# Patient Record
Sex: Female | Born: 1950 | Race: White | Hispanic: No | Marital: Married | Smoking: Former smoker
Health system: Southern US, Community
[De-identification: ages and names within clinical notes are randomized; demographics above are authoritative.]

## PROBLEM LIST (undated history)

## (undated) DIAGNOSIS — R102 Pelvic and perineal pain: Secondary | ICD-10-CM

## (undated) DIAGNOSIS — Z860100 Personal history of colon polyps, unspecified: Secondary | ICD-10-CM

## (undated) DIAGNOSIS — G4700 Insomnia, unspecified: Secondary | ICD-10-CM

## (undated) DIAGNOSIS — E039 Hypothyroidism, unspecified: Secondary | ICD-10-CM

## (undated) DIAGNOSIS — E785 Hyperlipidemia, unspecified: Secondary | ICD-10-CM

## (undated) DIAGNOSIS — I10 Essential (primary) hypertension: Secondary | ICD-10-CM

## (undated) DIAGNOSIS — Q159 Congenital malformation of eye, unspecified: Secondary | ICD-10-CM

## (undated) DIAGNOSIS — K8020 Calculus of gallbladder without cholecystitis without obstruction: Secondary | ICD-10-CM

## (undated) DIAGNOSIS — C50919 Malignant neoplasm of unspecified site of unspecified female breast: Secondary | ICD-10-CM

## (undated) DIAGNOSIS — N390 Urinary tract infection, site not specified: Secondary | ICD-10-CM

## (undated) DIAGNOSIS — K5900 Constipation, unspecified: Secondary | ICD-10-CM

## (undated) DIAGNOSIS — Z800 Family history of malignant neoplasm of digestive organs: Secondary | ICD-10-CM

## (undated) HISTORY — PX: BREAST LUMPECTOMY: SHX2

## (undated) HISTORY — DX: Calculus of gallbladder without cholecystitis without obstruction: K80.20

## (undated) HISTORY — PX: BREAST BIOPSY: SHX20

## (undated) HISTORY — DX: Hyperlipidemia, unspecified: E78.5

## (undated) HISTORY — DX: Constipation, unspecified: K59.00

## (undated) HISTORY — DX: Personal history of colon polyps, unspecified: Z86.0100

## (undated) HISTORY — DX: Congenital malformation of eye, unspecified: Q15.9

## (undated) HISTORY — DX: Hypothyroidism, unspecified: E03.9

## (undated) HISTORY — DX: Urinary tract infection, site not specified: N39.0

## (undated) HISTORY — PX: ABDOMINAL HYSTERECTOMY: SUR658

## (undated) HISTORY — DX: Pelvic and perineal pain: R10.2

## (undated) HISTORY — DX: Essential (primary) hypertension: I10

## (undated) HISTORY — DX: Family history of malignant neoplasm of digestive organs: Z80.0

## (undated) HISTORY — DX: Insomnia, unspecified: G47.00

## (undated) HISTORY — DX: Personal history of colonic polyps: Z86.010

---

## 2013-04-28 DIAGNOSIS — E039 Hypothyroidism, unspecified: Secondary | ICD-10-CM

## 2013-04-28 DIAGNOSIS — H35359 Cystoid macular degeneration, unspecified eye: Secondary | ICD-10-CM

## 2013-04-28 HISTORY — DX: Cystoid macular degeneration, unspecified eye: H35.359

## 2013-04-28 HISTORY — DX: Hypothyroidism, unspecified: E03.9

## 2013-05-12 ENCOUNTER — Encounter (INDEPENDENT_AMBULATORY_CARE_PROVIDER_SITE_OTHER): Payer: Self-pay | Admitting: Ophthalmology

## 2013-07-09 ENCOUNTER — Other Ambulatory Visit: Payer: Self-pay | Admitting: Nurse Practitioner

## 2013-07-09 DIAGNOSIS — H3552 Pigmentary retinal dystrophy: Secondary | ICD-10-CM

## 2013-07-09 DIAGNOSIS — Z1231 Encounter for screening mammogram for malignant neoplasm of breast: Secondary | ICD-10-CM

## 2013-07-09 HISTORY — DX: Pigmentary retinal dystrophy: H35.52

## 2013-07-14 DIAGNOSIS — F325 Major depressive disorder, single episode, in full remission: Secondary | ICD-10-CM

## 2013-07-14 HISTORY — DX: Major depressive disorder, single episode, in full remission: F32.5

## 2014-11-24 ENCOUNTER — Ambulatory Visit: Payer: Self-pay | Admitting: Obstetrics & Gynecology

## 2015-04-03 DIAGNOSIS — H3552 Pigmentary retinal dystrophy: Secondary | ICD-10-CM | POA: Diagnosis not present

## 2015-04-03 DIAGNOSIS — H43812 Vitreous degeneration, left eye: Secondary | ICD-10-CM | POA: Diagnosis not present

## 2015-04-03 DIAGNOSIS — H35353 Cystoid macular degeneration, bilateral: Secondary | ICD-10-CM | POA: Diagnosis not present

## 2015-04-12 DIAGNOSIS — F341 Dysthymic disorder: Secondary | ICD-10-CM | POA: Diagnosis not present

## 2015-04-12 DIAGNOSIS — E039 Hypothyroidism, unspecified: Secondary | ICD-10-CM | POA: Diagnosis not present

## 2015-05-23 DIAGNOSIS — R413 Other amnesia: Secondary | ICD-10-CM | POA: Diagnosis not present

## 2015-06-05 DIAGNOSIS — R413 Other amnesia: Secondary | ICD-10-CM | POA: Diagnosis not present

## 2015-06-06 DIAGNOSIS — H3552 Pigmentary retinal dystrophy: Secondary | ICD-10-CM | POA: Diagnosis not present

## 2015-06-06 DIAGNOSIS — H43812 Vitreous degeneration, left eye: Secondary | ICD-10-CM | POA: Diagnosis not present

## 2015-06-06 DIAGNOSIS — H35353 Cystoid macular degeneration, bilateral: Secondary | ICD-10-CM | POA: Diagnosis not present

## 2015-06-14 DIAGNOSIS — R413 Other amnesia: Secondary | ICD-10-CM | POA: Diagnosis not present

## 2015-08-04 DIAGNOSIS — H35353 Cystoid macular degeneration, bilateral: Secondary | ICD-10-CM | POA: Diagnosis not present

## 2015-08-04 DIAGNOSIS — H43812 Vitreous degeneration, left eye: Secondary | ICD-10-CM | POA: Diagnosis not present

## 2015-08-04 DIAGNOSIS — H3552 Pigmentary retinal dystrophy: Secondary | ICD-10-CM | POA: Diagnosis not present

## 2015-11-21 DIAGNOSIS — H35353 Cystoid macular degeneration, bilateral: Secondary | ICD-10-CM | POA: Diagnosis not present

## 2015-11-21 DIAGNOSIS — H35372 Puckering of macula, left eye: Secondary | ICD-10-CM | POA: Diagnosis not present

## 2015-11-21 DIAGNOSIS — H43812 Vitreous degeneration, left eye: Secondary | ICD-10-CM | POA: Diagnosis not present

## 2015-11-21 DIAGNOSIS — H3552 Pigmentary retinal dystrophy: Secondary | ICD-10-CM | POA: Diagnosis not present

## 2015-12-06 ENCOUNTER — Other Ambulatory Visit: Payer: Self-pay | Admitting: Chiropractor

## 2015-12-06 ENCOUNTER — Ambulatory Visit
Admission: RE | Admit: 2015-12-06 | Discharge: 2015-12-06 | Disposition: A | Discharge status: Home / Self Care | Payer: Medicare Other | Source: Ambulatory Visit | Attending: Chiropractor | Admitting: Chiropractor

## 2015-12-06 DIAGNOSIS — M545 Low back pain: Secondary | ICD-10-CM

## 2015-12-06 DIAGNOSIS — M25561 Pain in right knee: Secondary | ICD-10-CM

## 2015-12-06 DIAGNOSIS — M47814 Spondylosis without myelopathy or radiculopathy, thoracic region: Secondary | ICD-10-CM | POA: Diagnosis not present

## 2015-12-11 DIAGNOSIS — M9905 Segmental and somatic dysfunction of pelvic region: Secondary | ICD-10-CM | POA: Diagnosis not present

## 2015-12-11 DIAGNOSIS — S29012A Strain of muscle and tendon of back wall of thorax, initial encounter: Secondary | ICD-10-CM | POA: Diagnosis not present

## 2015-12-11 DIAGNOSIS — S39012A Strain of muscle, fascia and tendon of lower back, initial encounter: Secondary | ICD-10-CM | POA: Diagnosis not present

## 2015-12-11 DIAGNOSIS — M9903 Segmental and somatic dysfunction of lumbar region: Secondary | ICD-10-CM | POA: Diagnosis not present

## 2015-12-11 DIAGNOSIS — S338XXA Sprain of other parts of lumbar spine and pelvis, initial encounter: Secondary | ICD-10-CM | POA: Diagnosis not present

## 2015-12-11 DIAGNOSIS — M9902 Segmental and somatic dysfunction of thoracic region: Secondary | ICD-10-CM | POA: Diagnosis not present

## 2015-12-15 DIAGNOSIS — M9903 Segmental and somatic dysfunction of lumbar region: Secondary | ICD-10-CM | POA: Diagnosis not present

## 2015-12-15 DIAGNOSIS — S338XXA Sprain of other parts of lumbar spine and pelvis, initial encounter: Secondary | ICD-10-CM | POA: Diagnosis not present

## 2015-12-15 DIAGNOSIS — S39012A Strain of muscle, fascia and tendon of lower back, initial encounter: Secondary | ICD-10-CM | POA: Diagnosis not present

## 2015-12-15 DIAGNOSIS — M9905 Segmental and somatic dysfunction of pelvic region: Secondary | ICD-10-CM | POA: Diagnosis not present

## 2015-12-15 DIAGNOSIS — S29012A Strain of muscle and tendon of back wall of thorax, initial encounter: Secondary | ICD-10-CM | POA: Diagnosis not present

## 2015-12-15 DIAGNOSIS — M9902 Segmental and somatic dysfunction of thoracic region: Secondary | ICD-10-CM | POA: Diagnosis not present

## 2015-12-18 DIAGNOSIS — S29012A Strain of muscle and tendon of back wall of thorax, initial encounter: Secondary | ICD-10-CM | POA: Diagnosis not present

## 2015-12-18 DIAGNOSIS — M9902 Segmental and somatic dysfunction of thoracic region: Secondary | ICD-10-CM | POA: Diagnosis not present

## 2015-12-18 DIAGNOSIS — S39012A Strain of muscle, fascia and tendon of lower back, initial encounter: Secondary | ICD-10-CM | POA: Diagnosis not present

## 2015-12-18 DIAGNOSIS — M9903 Segmental and somatic dysfunction of lumbar region: Secondary | ICD-10-CM | POA: Diagnosis not present

## 2015-12-18 DIAGNOSIS — S338XXA Sprain of other parts of lumbar spine and pelvis, initial encounter: Secondary | ICD-10-CM | POA: Diagnosis not present

## 2015-12-18 DIAGNOSIS — M9905 Segmental and somatic dysfunction of pelvic region: Secondary | ICD-10-CM | POA: Diagnosis not present

## 2015-12-22 DIAGNOSIS — S29012A Strain of muscle and tendon of back wall of thorax, initial encounter: Secondary | ICD-10-CM | POA: Diagnosis not present

## 2015-12-22 DIAGNOSIS — M9903 Segmental and somatic dysfunction of lumbar region: Secondary | ICD-10-CM | POA: Diagnosis not present

## 2015-12-22 DIAGNOSIS — S39012A Strain of muscle, fascia and tendon of lower back, initial encounter: Secondary | ICD-10-CM | POA: Diagnosis not present

## 2015-12-22 DIAGNOSIS — M9902 Segmental and somatic dysfunction of thoracic region: Secondary | ICD-10-CM | POA: Diagnosis not present

## 2015-12-22 DIAGNOSIS — M9905 Segmental and somatic dysfunction of pelvic region: Secondary | ICD-10-CM | POA: Diagnosis not present

## 2015-12-22 DIAGNOSIS — S338XXA Sprain of other parts of lumbar spine and pelvis, initial encounter: Secondary | ICD-10-CM | POA: Diagnosis not present

## 2015-12-25 DIAGNOSIS — M9903 Segmental and somatic dysfunction of lumbar region: Secondary | ICD-10-CM | POA: Diagnosis not present

## 2015-12-25 DIAGNOSIS — S338XXA Sprain of other parts of lumbar spine and pelvis, initial encounter: Secondary | ICD-10-CM | POA: Diagnosis not present

## 2015-12-25 DIAGNOSIS — M9902 Segmental and somatic dysfunction of thoracic region: Secondary | ICD-10-CM | POA: Diagnosis not present

## 2015-12-25 DIAGNOSIS — M9905 Segmental and somatic dysfunction of pelvic region: Secondary | ICD-10-CM | POA: Diagnosis not present

## 2015-12-25 DIAGNOSIS — S39012A Strain of muscle, fascia and tendon of lower back, initial encounter: Secondary | ICD-10-CM | POA: Diagnosis not present

## 2015-12-25 DIAGNOSIS — S29012A Strain of muscle and tendon of back wall of thorax, initial encounter: Secondary | ICD-10-CM | POA: Diagnosis not present

## 2015-12-27 DIAGNOSIS — Z23 Encounter for immunization: Secondary | ICD-10-CM | POA: Diagnosis not present

## 2015-12-28 DIAGNOSIS — S39012A Strain of muscle, fascia and tendon of lower back, initial encounter: Secondary | ICD-10-CM | POA: Diagnosis not present

## 2015-12-28 DIAGNOSIS — M9905 Segmental and somatic dysfunction of pelvic region: Secondary | ICD-10-CM | POA: Diagnosis not present

## 2015-12-28 DIAGNOSIS — M9903 Segmental and somatic dysfunction of lumbar region: Secondary | ICD-10-CM | POA: Diagnosis not present

## 2015-12-28 DIAGNOSIS — M9902 Segmental and somatic dysfunction of thoracic region: Secondary | ICD-10-CM | POA: Diagnosis not present

## 2015-12-28 DIAGNOSIS — S29012A Strain of muscle and tendon of back wall of thorax, initial encounter: Secondary | ICD-10-CM | POA: Diagnosis not present

## 2015-12-28 DIAGNOSIS — S338XXA Sprain of other parts of lumbar spine and pelvis, initial encounter: Secondary | ICD-10-CM | POA: Diagnosis not present

## 2016-01-04 DIAGNOSIS — S39012A Strain of muscle, fascia and tendon of lower back, initial encounter: Secondary | ICD-10-CM | POA: Diagnosis not present

## 2016-01-04 DIAGNOSIS — S29012A Strain of muscle and tendon of back wall of thorax, initial encounter: Secondary | ICD-10-CM | POA: Diagnosis not present

## 2016-01-04 DIAGNOSIS — S338XXA Sprain of other parts of lumbar spine and pelvis, initial encounter: Secondary | ICD-10-CM | POA: Diagnosis not present

## 2016-01-04 DIAGNOSIS — M9902 Segmental and somatic dysfunction of thoracic region: Secondary | ICD-10-CM | POA: Diagnosis not present

## 2016-01-04 DIAGNOSIS — M9905 Segmental and somatic dysfunction of pelvic region: Secondary | ICD-10-CM | POA: Diagnosis not present

## 2016-01-04 DIAGNOSIS — M9903 Segmental and somatic dysfunction of lumbar region: Secondary | ICD-10-CM | POA: Diagnosis not present

## 2016-01-09 DIAGNOSIS — M9905 Segmental and somatic dysfunction of pelvic region: Secondary | ICD-10-CM | POA: Diagnosis not present

## 2016-01-09 DIAGNOSIS — M9903 Segmental and somatic dysfunction of lumbar region: Secondary | ICD-10-CM | POA: Diagnosis not present

## 2016-01-09 DIAGNOSIS — S338XXA Sprain of other parts of lumbar spine and pelvis, initial encounter: Secondary | ICD-10-CM | POA: Diagnosis not present

## 2016-01-09 DIAGNOSIS — S39012A Strain of muscle, fascia and tendon of lower back, initial encounter: Secondary | ICD-10-CM | POA: Diagnosis not present

## 2016-01-09 DIAGNOSIS — S29012A Strain of muscle and tendon of back wall of thorax, initial encounter: Secondary | ICD-10-CM | POA: Diagnosis not present

## 2016-01-09 DIAGNOSIS — M9902 Segmental and somatic dysfunction of thoracic region: Secondary | ICD-10-CM | POA: Diagnosis not present

## 2016-01-23 DIAGNOSIS — S29012A Strain of muscle and tendon of back wall of thorax, initial encounter: Secondary | ICD-10-CM | POA: Diagnosis not present

## 2016-01-23 DIAGNOSIS — M9905 Segmental and somatic dysfunction of pelvic region: Secondary | ICD-10-CM | POA: Diagnosis not present

## 2016-01-23 DIAGNOSIS — S39012A Strain of muscle, fascia and tendon of lower back, initial encounter: Secondary | ICD-10-CM | POA: Diagnosis not present

## 2016-01-23 DIAGNOSIS — M9902 Segmental and somatic dysfunction of thoracic region: Secondary | ICD-10-CM | POA: Diagnosis not present

## 2016-01-23 DIAGNOSIS — M9903 Segmental and somatic dysfunction of lumbar region: Secondary | ICD-10-CM | POA: Diagnosis not present

## 2016-01-23 DIAGNOSIS — S338XXA Sprain of other parts of lumbar spine and pelvis, initial encounter: Secondary | ICD-10-CM | POA: Diagnosis not present

## 2016-02-06 DIAGNOSIS — M9903 Segmental and somatic dysfunction of lumbar region: Secondary | ICD-10-CM | POA: Diagnosis not present

## 2016-02-06 DIAGNOSIS — M9902 Segmental and somatic dysfunction of thoracic region: Secondary | ICD-10-CM | POA: Diagnosis not present

## 2016-02-06 DIAGNOSIS — S29012A Strain of muscle and tendon of back wall of thorax, initial encounter: Secondary | ICD-10-CM | POA: Diagnosis not present

## 2016-02-06 DIAGNOSIS — S39012A Strain of muscle, fascia and tendon of lower back, initial encounter: Secondary | ICD-10-CM | POA: Diagnosis not present

## 2016-02-06 DIAGNOSIS — M9905 Segmental and somatic dysfunction of pelvic region: Secondary | ICD-10-CM | POA: Diagnosis not present

## 2016-02-06 DIAGNOSIS — S338XXA Sprain of other parts of lumbar spine and pelvis, initial encounter: Secondary | ICD-10-CM | POA: Diagnosis not present

## 2016-02-09 DIAGNOSIS — H3552 Pigmentary retinal dystrophy: Secondary | ICD-10-CM | POA: Diagnosis not present

## 2016-02-09 DIAGNOSIS — H35372 Puckering of macula, left eye: Secondary | ICD-10-CM | POA: Diagnosis not present

## 2016-02-09 DIAGNOSIS — H35353 Cystoid macular degeneration, bilateral: Secondary | ICD-10-CM | POA: Diagnosis not present

## 2016-02-09 DIAGNOSIS — H43812 Vitreous degeneration, left eye: Secondary | ICD-10-CM | POA: Diagnosis not present

## 2016-02-14 DIAGNOSIS — K449 Diaphragmatic hernia without obstruction or gangrene: Secondary | ICD-10-CM | POA: Insufficient documentation

## 2016-02-14 DIAGNOSIS — E039 Hypothyroidism, unspecified: Secondary | ICD-10-CM | POA: Diagnosis not present

## 2016-02-14 DIAGNOSIS — Z1231 Encounter for screening mammogram for malignant neoplasm of breast: Secondary | ICD-10-CM | POA: Diagnosis not present

## 2016-02-14 DIAGNOSIS — Z1159 Encounter for screening for other viral diseases: Secondary | ICD-10-CM | POA: Diagnosis not present

## 2016-02-14 DIAGNOSIS — F341 Dysthymic disorder: Secondary | ICD-10-CM | POA: Diagnosis not present

## 2016-02-14 DIAGNOSIS — F17210 Nicotine dependence, cigarettes, uncomplicated: Secondary | ICD-10-CM | POA: Diagnosis not present

## 2016-02-14 HISTORY — DX: Diaphragmatic hernia without obstruction or gangrene: K44.9

## 2016-02-21 ENCOUNTER — Other Ambulatory Visit: Payer: Self-pay | Admitting: Nurse Practitioner

## 2016-02-21 DIAGNOSIS — Z1231 Encounter for screening mammogram for malignant neoplasm of breast: Secondary | ICD-10-CM

## 2016-02-21 DIAGNOSIS — E2839 Other primary ovarian failure: Secondary | ICD-10-CM

## 2016-02-27 DIAGNOSIS — M9903 Segmental and somatic dysfunction of lumbar region: Secondary | ICD-10-CM | POA: Diagnosis not present

## 2016-02-27 DIAGNOSIS — M9905 Segmental and somatic dysfunction of pelvic region: Secondary | ICD-10-CM | POA: Diagnosis not present

## 2016-02-27 DIAGNOSIS — S39012A Strain of muscle, fascia and tendon of lower back, initial encounter: Secondary | ICD-10-CM | POA: Diagnosis not present

## 2016-02-27 DIAGNOSIS — M9902 Segmental and somatic dysfunction of thoracic region: Secondary | ICD-10-CM | POA: Diagnosis not present

## 2016-02-27 DIAGNOSIS — S338XXA Sprain of other parts of lumbar spine and pelvis, initial encounter: Secondary | ICD-10-CM | POA: Diagnosis not present

## 2016-02-27 DIAGNOSIS — S29012A Strain of muscle and tendon of back wall of thorax, initial encounter: Secondary | ICD-10-CM | POA: Diagnosis not present

## 2016-03-22 DIAGNOSIS — H35373 Puckering of macula, bilateral: Secondary | ICD-10-CM | POA: Diagnosis not present

## 2016-03-22 DIAGNOSIS — H43812 Vitreous degeneration, left eye: Secondary | ICD-10-CM | POA: Diagnosis not present

## 2016-03-22 DIAGNOSIS — H35353 Cystoid macular degeneration, bilateral: Secondary | ICD-10-CM | POA: Diagnosis not present

## 2016-03-22 DIAGNOSIS — H3552 Pigmentary retinal dystrophy: Secondary | ICD-10-CM | POA: Diagnosis not present

## 2016-03-26 DIAGNOSIS — S29012A Strain of muscle and tendon of back wall of thorax, initial encounter: Secondary | ICD-10-CM | POA: Diagnosis not present

## 2016-03-26 DIAGNOSIS — M9902 Segmental and somatic dysfunction of thoracic region: Secondary | ICD-10-CM | POA: Diagnosis not present

## 2016-03-26 DIAGNOSIS — M9903 Segmental and somatic dysfunction of lumbar region: Secondary | ICD-10-CM | POA: Diagnosis not present

## 2016-03-26 DIAGNOSIS — M9905 Segmental and somatic dysfunction of pelvic region: Secondary | ICD-10-CM | POA: Diagnosis not present

## 2016-03-26 DIAGNOSIS — S338XXA Sprain of other parts of lumbar spine and pelvis, initial encounter: Secondary | ICD-10-CM | POA: Diagnosis not present

## 2016-03-26 DIAGNOSIS — S39012A Strain of muscle, fascia and tendon of lower back, initial encounter: Secondary | ICD-10-CM | POA: Diagnosis not present

## 2016-04-26 DIAGNOSIS — M9905 Segmental and somatic dysfunction of pelvic region: Secondary | ICD-10-CM | POA: Diagnosis not present

## 2016-04-26 DIAGNOSIS — S29012A Strain of muscle and tendon of back wall of thorax, initial encounter: Secondary | ICD-10-CM | POA: Diagnosis not present

## 2016-04-26 DIAGNOSIS — S338XXA Sprain of other parts of lumbar spine and pelvis, initial encounter: Secondary | ICD-10-CM | POA: Diagnosis not present

## 2016-04-26 DIAGNOSIS — M9902 Segmental and somatic dysfunction of thoracic region: Secondary | ICD-10-CM | POA: Diagnosis not present

## 2016-04-26 DIAGNOSIS — S39012A Strain of muscle, fascia and tendon of lower back, initial encounter: Secondary | ICD-10-CM | POA: Diagnosis not present

## 2016-04-26 DIAGNOSIS — M9903 Segmental and somatic dysfunction of lumbar region: Secondary | ICD-10-CM | POA: Diagnosis not present

## 2016-05-24 DIAGNOSIS — H35353 Cystoid macular degeneration, bilateral: Secondary | ICD-10-CM | POA: Diagnosis not present

## 2016-05-24 DIAGNOSIS — H3552 Pigmentary retinal dystrophy: Secondary | ICD-10-CM | POA: Diagnosis not present

## 2016-05-24 DIAGNOSIS — H43812 Vitreous degeneration, left eye: Secondary | ICD-10-CM | POA: Diagnosis not present

## 2016-05-24 DIAGNOSIS — H35373 Puckering of macula, bilateral: Secondary | ICD-10-CM | POA: Diagnosis not present

## 2016-07-15 DIAGNOSIS — M9902 Segmental and somatic dysfunction of thoracic region: Secondary | ICD-10-CM | POA: Diagnosis not present

## 2016-07-15 DIAGNOSIS — S338XXA Sprain of other parts of lumbar spine and pelvis, initial encounter: Secondary | ICD-10-CM | POA: Diagnosis not present

## 2016-07-15 DIAGNOSIS — M9905 Segmental and somatic dysfunction of pelvic region: Secondary | ICD-10-CM | POA: Diagnosis not present

## 2016-07-15 DIAGNOSIS — S39012A Strain of muscle, fascia and tendon of lower back, initial encounter: Secondary | ICD-10-CM | POA: Diagnosis not present

## 2016-07-15 DIAGNOSIS — M9903 Segmental and somatic dysfunction of lumbar region: Secondary | ICD-10-CM | POA: Diagnosis not present

## 2016-07-15 DIAGNOSIS — S29012A Strain of muscle and tendon of back wall of thorax, initial encounter: Secondary | ICD-10-CM | POA: Diagnosis not present

## 2016-08-16 DIAGNOSIS — H43821 Vitreomacular adhesion, right eye: Secondary | ICD-10-CM | POA: Diagnosis not present

## 2016-08-16 DIAGNOSIS — H35353 Cystoid macular degeneration, bilateral: Secondary | ICD-10-CM | POA: Diagnosis not present

## 2016-08-16 DIAGNOSIS — H43812 Vitreous degeneration, left eye: Secondary | ICD-10-CM | POA: Diagnosis not present

## 2016-08-16 DIAGNOSIS — H3552 Pigmentary retinal dystrophy: Secondary | ICD-10-CM | POA: Diagnosis not present

## 2016-11-15 DIAGNOSIS — Z23 Encounter for immunization: Secondary | ICD-10-CM | POA: Diagnosis not present

## 2016-11-15 DIAGNOSIS — H3581 Retinal edema: Secondary | ICD-10-CM | POA: Diagnosis not present

## 2016-11-15 DIAGNOSIS — E039 Hypothyroidism, unspecified: Secondary | ICD-10-CM | POA: Diagnosis not present

## 2016-11-15 DIAGNOSIS — F320 Major depressive disorder, single episode, mild: Secondary | ICD-10-CM | POA: Diagnosis not present

## 2016-11-22 DIAGNOSIS — H35373 Puckering of macula, bilateral: Secondary | ICD-10-CM | POA: Diagnosis not present

## 2016-11-22 DIAGNOSIS — H35353 Cystoid macular degeneration, bilateral: Secondary | ICD-10-CM | POA: Diagnosis not present

## 2016-11-22 DIAGNOSIS — H43821 Vitreomacular adhesion, right eye: Secondary | ICD-10-CM | POA: Diagnosis not present

## 2016-11-22 DIAGNOSIS — H43812 Vitreous degeneration, left eye: Secondary | ICD-10-CM | POA: Diagnosis not present

## 2017-04-23 DIAGNOSIS — H35341 Macular cyst, hole, or pseudohole, right eye: Secondary | ICD-10-CM | POA: Diagnosis not present

## 2017-04-23 DIAGNOSIS — H3552 Pigmentary retinal dystrophy: Secondary | ICD-10-CM | POA: Diagnosis not present

## 2017-04-23 DIAGNOSIS — H35353 Cystoid macular degeneration, bilateral: Secondary | ICD-10-CM | POA: Diagnosis not present

## 2017-04-23 DIAGNOSIS — H35373 Puckering of macula, bilateral: Secondary | ICD-10-CM | POA: Diagnosis not present

## 2017-08-22 DIAGNOSIS — H35353 Cystoid macular degeneration, bilateral: Secondary | ICD-10-CM | POA: Diagnosis not present

## 2017-08-22 DIAGNOSIS — H35373 Puckering of macula, bilateral: Secondary | ICD-10-CM | POA: Diagnosis not present

## 2017-08-22 DIAGNOSIS — H30043 Focal chorioretinal inflammation, macular or paramacular, bilateral: Secondary | ICD-10-CM | POA: Diagnosis not present

## 2017-08-22 DIAGNOSIS — H3552 Pigmentary retinal dystrophy: Secondary | ICD-10-CM | POA: Diagnosis not present

## 2017-09-12 DIAGNOSIS — H35352 Cystoid macular degeneration, left eye: Secondary | ICD-10-CM | POA: Diagnosis not present

## 2017-09-12 DIAGNOSIS — H30042 Focal chorioretinal inflammation, macular or paramacular, left eye: Secondary | ICD-10-CM | POA: Diagnosis not present

## 2017-09-26 DIAGNOSIS — Z131 Encounter for screening for diabetes mellitus: Secondary | ICD-10-CM | POA: Diagnosis not present

## 2017-09-26 DIAGNOSIS — H3581 Retinal edema: Secondary | ICD-10-CM | POA: Diagnosis not present

## 2017-09-26 DIAGNOSIS — Z0000 Encounter for general adult medical examination without abnormal findings: Secondary | ICD-10-CM | POA: Diagnosis not present

## 2017-09-26 DIAGNOSIS — Z1239 Encounter for other screening for malignant neoplasm of breast: Secondary | ICD-10-CM | POA: Diagnosis not present

## 2017-09-26 DIAGNOSIS — Z136 Encounter for screening for cardiovascular disorders: Secondary | ICD-10-CM | POA: Diagnosis not present

## 2017-09-26 DIAGNOSIS — F320 Major depressive disorder, single episode, mild: Secondary | ICD-10-CM | POA: Diagnosis not present

## 2017-09-26 DIAGNOSIS — E039 Hypothyroidism, unspecified: Secondary | ICD-10-CM | POA: Diagnosis not present

## 2017-09-30 ENCOUNTER — Other Ambulatory Visit: Payer: Self-pay | Admitting: Family Medicine

## 2017-09-30 DIAGNOSIS — Z1231 Encounter for screening mammogram for malignant neoplasm of breast: Secondary | ICD-10-CM

## 2017-10-14 DIAGNOSIS — H43821 Vitreomacular adhesion, right eye: Secondary | ICD-10-CM | POA: Diagnosis not present

## 2017-10-14 DIAGNOSIS — H35373 Puckering of macula, bilateral: Secondary | ICD-10-CM | POA: Diagnosis not present

## 2017-10-14 DIAGNOSIS — H35351 Cystoid macular degeneration, right eye: Secondary | ICD-10-CM | POA: Diagnosis not present

## 2017-10-14 DIAGNOSIS — H3552 Pigmentary retinal dystrophy: Secondary | ICD-10-CM | POA: Diagnosis not present

## 2017-10-17 ENCOUNTER — Ambulatory Visit
Admission: RE | Admit: 2017-10-17 | Discharge: 2017-10-17 | Disposition: A | Discharge status: Home / Self Care | Payer: Medicare HMO | Source: Ambulatory Visit | Attending: Family Medicine | Admitting: Family Medicine

## 2017-10-17 DIAGNOSIS — Z1231 Encounter for screening mammogram for malignant neoplasm of breast: Secondary | ICD-10-CM

## 2017-12-19 DIAGNOSIS — Z961 Presence of intraocular lens: Secondary | ICD-10-CM | POA: Diagnosis not present

## 2017-12-19 DIAGNOSIS — H3581 Retinal edema: Secondary | ICD-10-CM | POA: Diagnosis not present

## 2017-12-19 DIAGNOSIS — H30033 Focal chorioretinal inflammation, peripheral, bilateral: Secondary | ICD-10-CM | POA: Insufficient documentation

## 2017-12-19 HISTORY — DX: Presence of intraocular lens: Z96.1

## 2017-12-19 HISTORY — DX: Focal chorioretinal inflammation, peripheral, bilateral: H30.033

## 2017-12-22 DIAGNOSIS — H30033 Focal chorioretinal inflammation, peripheral, bilateral: Secondary | ICD-10-CM | POA: Diagnosis not present

## 2017-12-29 DIAGNOSIS — H30033 Focal chorioretinal inflammation, peripheral, bilateral: Secondary | ICD-10-CM | POA: Diagnosis not present

## 2018-01-01 DIAGNOSIS — H3581 Retinal edema: Secondary | ICD-10-CM | POA: Diagnosis not present

## 2018-01-01 DIAGNOSIS — H30033 Focal chorioretinal inflammation, peripheral, bilateral: Secondary | ICD-10-CM | POA: Diagnosis not present

## 2018-01-01 DIAGNOSIS — Z961 Presence of intraocular lens: Secondary | ICD-10-CM | POA: Diagnosis not present

## 2018-01-06 DIAGNOSIS — Z800 Family history of malignant neoplasm of digestive organs: Secondary | ICD-10-CM | POA: Diagnosis not present

## 2018-01-06 DIAGNOSIS — Z86010 Personal history of colonic polyps: Secondary | ICD-10-CM | POA: Diagnosis not present

## 2018-01-06 DIAGNOSIS — D126 Benign neoplasm of colon, unspecified: Secondary | ICD-10-CM | POA: Diagnosis not present

## 2018-01-06 DIAGNOSIS — K5730 Diverticulosis of large intestine without perforation or abscess without bleeding: Secondary | ICD-10-CM | POA: Diagnosis not present

## 2018-01-09 DIAGNOSIS — D126 Benign neoplasm of colon, unspecified: Secondary | ICD-10-CM | POA: Diagnosis not present

## 2018-03-24 DIAGNOSIS — Z79899 Other long term (current) drug therapy: Secondary | ICD-10-CM | POA: Diagnosis not present

## 2018-03-24 DIAGNOSIS — H30033 Focal chorioretinal inflammation, peripheral, bilateral: Secondary | ICD-10-CM | POA: Diagnosis not present

## 2018-03-24 DIAGNOSIS — H44113 Panuveitis, bilateral: Secondary | ICD-10-CM | POA: Diagnosis not present

## 2018-03-24 DIAGNOSIS — H3581 Retinal edema: Secondary | ICD-10-CM | POA: Diagnosis not present

## 2018-03-24 DIAGNOSIS — Z961 Presence of intraocular lens: Secondary | ICD-10-CM | POA: Diagnosis not present

## 2018-06-02 DIAGNOSIS — Z79899 Other long term (current) drug therapy: Secondary | ICD-10-CM | POA: Diagnosis not present

## 2018-06-02 DIAGNOSIS — H30033 Focal chorioretinal inflammation, peripheral, bilateral: Secondary | ICD-10-CM | POA: Diagnosis not present

## 2018-06-02 DIAGNOSIS — H3581 Retinal edema: Secondary | ICD-10-CM | POA: Diagnosis not present

## 2018-06-02 DIAGNOSIS — Z961 Presence of intraocular lens: Secondary | ICD-10-CM | POA: Diagnosis not present

## 2018-06-02 DIAGNOSIS — H44113 Panuveitis, bilateral: Secondary | ICD-10-CM | POA: Diagnosis not present

## 2018-06-10 DIAGNOSIS — H44113 Panuveitis, bilateral: Secondary | ICD-10-CM | POA: Diagnosis not present

## 2018-06-10 DIAGNOSIS — H30033 Focal chorioretinal inflammation, peripheral, bilateral: Secondary | ICD-10-CM | POA: Diagnosis not present

## 2018-06-10 DIAGNOSIS — Z79899 Other long term (current) drug therapy: Secondary | ICD-10-CM | POA: Diagnosis not present

## 2018-08-11 DIAGNOSIS — H44113 Panuveitis, bilateral: Secondary | ICD-10-CM | POA: Diagnosis not present

## 2018-08-11 DIAGNOSIS — H30033 Focal chorioretinal inflammation, peripheral, bilateral: Secondary | ICD-10-CM | POA: Diagnosis not present

## 2018-08-11 DIAGNOSIS — H3581 Retinal edema: Secondary | ICD-10-CM | POA: Diagnosis not present

## 2018-08-11 DIAGNOSIS — Z961 Presence of intraocular lens: Secondary | ICD-10-CM | POA: Diagnosis not present

## 2018-08-11 DIAGNOSIS — Z79899 Other long term (current) drug therapy: Secondary | ICD-10-CM | POA: Diagnosis not present

## 2018-10-02 DIAGNOSIS — E785 Hyperlipidemia, unspecified: Secondary | ICD-10-CM | POA: Diagnosis not present

## 2018-10-02 DIAGNOSIS — H3552 Pigmentary retinal dystrophy: Secondary | ICD-10-CM | POA: Diagnosis not present

## 2018-10-02 DIAGNOSIS — Z0000 Encounter for general adult medical examination without abnormal findings: Secondary | ICD-10-CM | POA: Diagnosis not present

## 2018-10-02 DIAGNOSIS — E039 Hypothyroidism, unspecified: Secondary | ICD-10-CM | POA: Diagnosis not present

## 2018-10-19 DIAGNOSIS — Z0000 Encounter for general adult medical examination without abnormal findings: Secondary | ICD-10-CM | POA: Diagnosis not present

## 2018-10-19 DIAGNOSIS — E785 Hyperlipidemia, unspecified: Secondary | ICD-10-CM | POA: Diagnosis not present

## 2018-10-19 DIAGNOSIS — E039 Hypothyroidism, unspecified: Secondary | ICD-10-CM | POA: Diagnosis not present

## 2018-10-27 DIAGNOSIS — H44113 Panuveitis, bilateral: Secondary | ICD-10-CM | POA: Diagnosis not present

## 2018-10-27 DIAGNOSIS — H30033 Focal chorioretinal inflammation, peripheral, bilateral: Secondary | ICD-10-CM | POA: Diagnosis not present

## 2018-10-27 DIAGNOSIS — Z961 Presence of intraocular lens: Secondary | ICD-10-CM | POA: Diagnosis not present

## 2018-10-27 DIAGNOSIS — H3581 Retinal edema: Secondary | ICD-10-CM | POA: Diagnosis not present

## 2018-10-27 DIAGNOSIS — Z79899 Other long term (current) drug therapy: Secondary | ICD-10-CM | POA: Diagnosis not present

## 2018-11-25 ENCOUNTER — Other Ambulatory Visit: Payer: Self-pay | Admitting: Family Medicine

## 2018-11-25 DIAGNOSIS — Z1231 Encounter for screening mammogram for malignant neoplasm of breast: Secondary | ICD-10-CM

## 2019-01-05 DIAGNOSIS — Z961 Presence of intraocular lens: Secondary | ICD-10-CM | POA: Diagnosis not present

## 2019-01-05 DIAGNOSIS — H30033 Focal chorioretinal inflammation, peripheral, bilateral: Secondary | ICD-10-CM | POA: Diagnosis not present

## 2019-01-05 DIAGNOSIS — H3581 Retinal edema: Secondary | ICD-10-CM | POA: Diagnosis not present

## 2019-01-05 DIAGNOSIS — Z79899 Other long term (current) drug therapy: Secondary | ICD-10-CM | POA: Diagnosis not present

## 2019-01-05 DIAGNOSIS — H30103 Unspecified disseminated chorioretinal inflammation, bilateral: Secondary | ICD-10-CM | POA: Insufficient documentation

## 2019-01-05 DIAGNOSIS — H44113 Panuveitis, bilateral: Secondary | ICD-10-CM | POA: Diagnosis not present

## 2019-01-05 HISTORY — DX: Unspecified disseminated chorioretinal inflammation, bilateral: H30.103

## 2019-01-07 ENCOUNTER — Ambulatory Visit
Admission: RE | Admit: 2019-01-07 | Discharge: 2019-01-07 | Disposition: A | Discharge status: Home / Self Care | Payer: Medicare HMO | Source: Ambulatory Visit | Attending: Family Medicine | Admitting: Family Medicine

## 2019-01-07 ENCOUNTER — Other Ambulatory Visit: Payer: Self-pay

## 2019-01-07 DIAGNOSIS — Z1231 Encounter for screening mammogram for malignant neoplasm of breast: Secondary | ICD-10-CM | POA: Diagnosis not present

## 2019-01-12 DIAGNOSIS — Z20828 Contact with and (suspected) exposure to other viral communicable diseases: Secondary | ICD-10-CM | POA: Diagnosis not present

## 2019-01-13 DIAGNOSIS — H26492 Other secondary cataract, left eye: Secondary | ICD-10-CM | POA: Diagnosis not present

## 2019-01-15 DIAGNOSIS — H4302 Vitreous prolapse, left eye: Secondary | ICD-10-CM | POA: Diagnosis not present

## 2019-01-15 DIAGNOSIS — H30102 Unspecified disseminated chorioretinal inflammation, left eye: Secondary | ICD-10-CM | POA: Diagnosis not present

## 2019-01-15 DIAGNOSIS — H1589 Other disorders of sclera: Secondary | ICD-10-CM | POA: Diagnosis not present

## 2019-01-15 DIAGNOSIS — H15822 Localized anterior staphyloma, left eye: Secondary | ICD-10-CM | POA: Diagnosis not present

## 2019-01-15 DIAGNOSIS — H30122 Disseminated chorioretinal inflammation, peripheral, left eye: Secondary | ICD-10-CM | POA: Diagnosis not present

## 2019-02-23 DIAGNOSIS — H30033 Focal chorioretinal inflammation, peripheral, bilateral: Secondary | ICD-10-CM | POA: Diagnosis not present

## 2019-02-23 DIAGNOSIS — H3581 Retinal edema: Secondary | ICD-10-CM | POA: Diagnosis not present

## 2019-02-23 DIAGNOSIS — Z79899 Other long term (current) drug therapy: Secondary | ICD-10-CM | POA: Diagnosis not present

## 2019-02-23 DIAGNOSIS — Z5181 Encounter for therapeutic drug level monitoring: Secondary | ICD-10-CM | POA: Diagnosis not present

## 2019-05-16 DIAGNOSIS — H6690 Otitis media, unspecified, unspecified ear: Secondary | ICD-10-CM | POA: Diagnosis not present

## 2019-05-16 DIAGNOSIS — H6123 Impacted cerumen, bilateral: Secondary | ICD-10-CM | POA: Diagnosis not present

## 2019-05-18 DIAGNOSIS — H30033 Focal chorioretinal inflammation, peripheral, bilateral: Secondary | ICD-10-CM | POA: Diagnosis not present

## 2019-05-18 DIAGNOSIS — H30103 Unspecified disseminated chorioretinal inflammation, bilateral: Secondary | ICD-10-CM | POA: Diagnosis not present

## 2019-05-18 DIAGNOSIS — Z79899 Other long term (current) drug therapy: Secondary | ICD-10-CM | POA: Diagnosis not present

## 2019-05-18 DIAGNOSIS — Z961 Presence of intraocular lens: Secondary | ICD-10-CM | POA: Diagnosis not present

## 2019-05-18 DIAGNOSIS — H3581 Retinal edema: Secondary | ICD-10-CM | POA: Diagnosis not present

## 2019-05-18 DIAGNOSIS — H44113 Panuveitis, bilateral: Secondary | ICD-10-CM | POA: Diagnosis not present

## 2019-05-19 DIAGNOSIS — Z79899 Other long term (current) drug therapy: Secondary | ICD-10-CM | POA: Diagnosis not present

## 2019-05-19 DIAGNOSIS — H44113 Panuveitis, bilateral: Secondary | ICD-10-CM | POA: Diagnosis not present

## 2019-05-19 DIAGNOSIS — H30033 Focal chorioretinal inflammation, peripheral, bilateral: Secondary | ICD-10-CM | POA: Diagnosis not present

## 2019-06-28 DIAGNOSIS — Z0100 Encounter for examination of eyes and vision without abnormal findings: Secondary | ICD-10-CM | POA: Diagnosis not present

## 2019-06-28 DIAGNOSIS — Z961 Presence of intraocular lens: Secondary | ICD-10-CM | POA: Diagnosis not present

## 2019-07-20 DIAGNOSIS — H30033 Focal chorioretinal inflammation, peripheral, bilateral: Secondary | ICD-10-CM | POA: Diagnosis not present

## 2019-07-20 DIAGNOSIS — H44113 Panuveitis, bilateral: Secondary | ICD-10-CM | POA: Diagnosis not present

## 2019-07-20 DIAGNOSIS — Z79899 Other long term (current) drug therapy: Secondary | ICD-10-CM | POA: Diagnosis not present

## 2019-07-20 DIAGNOSIS — Z961 Presence of intraocular lens: Secondary | ICD-10-CM | POA: Diagnosis not present

## 2019-07-20 DIAGNOSIS — H30103 Unspecified disseminated chorioretinal inflammation, bilateral: Secondary | ICD-10-CM | POA: Diagnosis not present

## 2019-07-20 DIAGNOSIS — H3581 Retinal edema: Secondary | ICD-10-CM | POA: Diagnosis not present

## 2019-07-23 DIAGNOSIS — H30033 Focal chorioretinal inflammation, peripheral, bilateral: Secondary | ICD-10-CM | POA: Diagnosis not present

## 2019-07-23 DIAGNOSIS — Z79899 Other long term (current) drug therapy: Secondary | ICD-10-CM | POA: Diagnosis not present

## 2019-08-11 DIAGNOSIS — H30033 Focal chorioretinal inflammation, peripheral, bilateral: Secondary | ICD-10-CM | POA: Diagnosis not present

## 2019-08-11 DIAGNOSIS — H3581 Retinal edema: Secondary | ICD-10-CM | POA: Diagnosis not present

## 2019-09-14 DIAGNOSIS — H30033 Focal chorioretinal inflammation, peripheral, bilateral: Secondary | ICD-10-CM | POA: Diagnosis not present

## 2019-09-14 DIAGNOSIS — H30103 Unspecified disseminated chorioretinal inflammation, bilateral: Secondary | ICD-10-CM | POA: Diagnosis not present

## 2019-09-14 DIAGNOSIS — H44113 Panuveitis, bilateral: Secondary | ICD-10-CM | POA: Diagnosis not present

## 2019-09-14 DIAGNOSIS — Z79899 Other long term (current) drug therapy: Secondary | ICD-10-CM | POA: Diagnosis not present

## 2019-09-14 DIAGNOSIS — Z961 Presence of intraocular lens: Secondary | ICD-10-CM | POA: Diagnosis not present

## 2019-09-14 DIAGNOSIS — H3581 Retinal edema: Secondary | ICD-10-CM | POA: Diagnosis not present

## 2019-10-15 DIAGNOSIS — Q159 Congenital malformation of eye, unspecified: Secondary | ICD-10-CM | POA: Diagnosis not present

## 2019-10-15 DIAGNOSIS — E785 Hyperlipidemia, unspecified: Secondary | ICD-10-CM | POA: Diagnosis not present

## 2019-10-15 DIAGNOSIS — Z0000 Encounter for general adult medical examination without abnormal findings: Secondary | ICD-10-CM | POA: Diagnosis not present

## 2019-10-15 DIAGNOSIS — E039 Hypothyroidism, unspecified: Secondary | ICD-10-CM | POA: Diagnosis not present

## 2019-10-21 ENCOUNTER — Other Ambulatory Visit: Payer: Self-pay | Admitting: Family Medicine

## 2019-10-21 DIAGNOSIS — Z1231 Encounter for screening mammogram for malignant neoplasm of breast: Secondary | ICD-10-CM

## 2019-11-01 DIAGNOSIS — Z79899 Other long term (current) drug therapy: Secondary | ICD-10-CM | POA: Diagnosis not present

## 2019-11-01 DIAGNOSIS — R300 Dysuria: Secondary | ICD-10-CM | POA: Diagnosis not present

## 2019-11-01 DIAGNOSIS — R413 Other amnesia: Secondary | ICD-10-CM | POA: Diagnosis not present

## 2019-11-01 DIAGNOSIS — R945 Abnormal results of liver function studies: Secondary | ICD-10-CM | POA: Diagnosis not present

## 2019-11-01 DIAGNOSIS — R7401 Elevation of levels of liver transaminase levels: Secondary | ICD-10-CM | POA: Diagnosis not present

## 2019-11-01 DIAGNOSIS — R7989 Other specified abnormal findings of blood chemistry: Secondary | ICD-10-CM | POA: Diagnosis not present

## 2019-11-21 DIAGNOSIS — R350 Frequency of micturition: Secondary | ICD-10-CM | POA: Diagnosis not present

## 2019-11-21 DIAGNOSIS — N3001 Acute cystitis with hematuria: Secondary | ICD-10-CM | POA: Diagnosis not present

## 2019-11-23 DIAGNOSIS — H44113 Panuveitis, bilateral: Secondary | ICD-10-CM | POA: Diagnosis not present

## 2019-11-23 DIAGNOSIS — H3581 Retinal edema: Secondary | ICD-10-CM | POA: Diagnosis not present

## 2019-11-23 DIAGNOSIS — Z79899 Other long term (current) drug therapy: Secondary | ICD-10-CM | POA: Diagnosis not present

## 2019-11-23 DIAGNOSIS — H30103 Unspecified disseminated chorioretinal inflammation, bilateral: Secondary | ICD-10-CM | POA: Diagnosis not present

## 2019-11-23 DIAGNOSIS — H30033 Focal chorioretinal inflammation, peripheral, bilateral: Secondary | ICD-10-CM | POA: Diagnosis not present

## 2019-11-23 DIAGNOSIS — Z961 Presence of intraocular lens: Secondary | ICD-10-CM | POA: Diagnosis not present

## 2019-11-24 DIAGNOSIS — R399 Unspecified symptoms and signs involving the genitourinary system: Secondary | ICD-10-CM | POA: Diagnosis not present

## 2019-11-25 DIAGNOSIS — N390 Urinary tract infection, site not specified: Secondary | ICD-10-CM | POA: Diagnosis not present

## 2019-11-25 DIAGNOSIS — R319 Hematuria, unspecified: Secondary | ICD-10-CM | POA: Diagnosis not present

## 2019-12-08 DIAGNOSIS — R1030 Lower abdominal pain, unspecified: Secondary | ICD-10-CM | POA: Diagnosis not present

## 2019-12-08 DIAGNOSIS — R300 Dysuria: Secondary | ICD-10-CM | POA: Diagnosis not present

## 2019-12-08 DIAGNOSIS — N390 Urinary tract infection, site not specified: Secondary | ICD-10-CM | POA: Diagnosis not present

## 2019-12-08 DIAGNOSIS — R351 Nocturia: Secondary | ICD-10-CM | POA: Diagnosis not present

## 2019-12-08 DIAGNOSIS — R319 Hematuria, unspecified: Secondary | ICD-10-CM | POA: Diagnosis not present

## 2019-12-15 DIAGNOSIS — K449 Diaphragmatic hernia without obstruction or gangrene: Secondary | ICD-10-CM | POA: Diagnosis not present

## 2019-12-15 DIAGNOSIS — K8020 Calculus of gallbladder without cholecystitis without obstruction: Secondary | ICD-10-CM | POA: Diagnosis not present

## 2019-12-15 DIAGNOSIS — R3915 Urgency of urination: Secondary | ICD-10-CM | POA: Diagnosis not present

## 2019-12-15 DIAGNOSIS — R319 Hematuria, unspecified: Secondary | ICD-10-CM | POA: Diagnosis not present

## 2019-12-15 DIAGNOSIS — R195 Other fecal abnormalities: Secondary | ICD-10-CM | POA: Diagnosis not present

## 2019-12-15 DIAGNOSIS — R351 Nocturia: Secondary | ICD-10-CM | POA: Diagnosis not present

## 2019-12-15 DIAGNOSIS — R102 Pelvic and perineal pain: Secondary | ICD-10-CM | POA: Diagnosis not present

## 2019-12-15 DIAGNOSIS — N390 Urinary tract infection, site not specified: Secondary | ICD-10-CM | POA: Diagnosis not present

## 2019-12-22 DIAGNOSIS — K5901 Slow transit constipation: Secondary | ICD-10-CM | POA: Diagnosis not present

## 2019-12-22 DIAGNOSIS — G4701 Insomnia due to medical condition: Secondary | ICD-10-CM | POA: Diagnosis not present

## 2019-12-24 DIAGNOSIS — R350 Frequency of micturition: Secondary | ICD-10-CM | POA: Diagnosis not present

## 2019-12-24 DIAGNOSIS — R413 Other amnesia: Secondary | ICD-10-CM | POA: Diagnosis not present

## 2019-12-24 DIAGNOSIS — E538 Deficiency of other specified B group vitamins: Secondary | ICD-10-CM | POA: Diagnosis not present

## 2019-12-24 DIAGNOSIS — E039 Hypothyroidism, unspecified: Secondary | ICD-10-CM | POA: Diagnosis not present

## 2019-12-24 DIAGNOSIS — G4700 Insomnia, unspecified: Secondary | ICD-10-CM | POA: Diagnosis not present

## 2020-01-10 ENCOUNTER — Ambulatory Visit
Admission: RE | Admit: 2020-01-10 | Discharge: 2020-01-10 | Disposition: A | Discharge status: Home / Self Care | Payer: Medicare HMO | Source: Ambulatory Visit | Attending: Family Medicine | Admitting: Family Medicine

## 2020-01-10 ENCOUNTER — Other Ambulatory Visit: Payer: Self-pay

## 2020-01-10 DIAGNOSIS — Z1231 Encounter for screening mammogram for malignant neoplasm of breast: Secondary | ICD-10-CM

## 2020-01-11 ENCOUNTER — Ambulatory Visit: Payer: Medicare HMO

## 2020-02-08 DIAGNOSIS — H3581 Retinal edema: Secondary | ICD-10-CM | POA: Diagnosis not present

## 2020-02-08 DIAGNOSIS — H44113 Panuveitis, bilateral: Secondary | ICD-10-CM | POA: Diagnosis not present

## 2020-02-08 DIAGNOSIS — H30103 Unspecified disseminated chorioretinal inflammation, bilateral: Secondary | ICD-10-CM | POA: Diagnosis not present

## 2020-02-08 DIAGNOSIS — Z79899 Other long term (current) drug therapy: Secondary | ICD-10-CM | POA: Diagnosis not present

## 2020-02-08 DIAGNOSIS — Z961 Presence of intraocular lens: Secondary | ICD-10-CM | POA: Diagnosis not present

## 2020-02-08 DIAGNOSIS — H30033 Focal chorioretinal inflammation, peripheral, bilateral: Secondary | ICD-10-CM | POA: Diagnosis not present

## 2020-02-09 DIAGNOSIS — R351 Nocturia: Secondary | ICD-10-CM | POA: Diagnosis not present

## 2020-02-09 DIAGNOSIS — R1030 Lower abdominal pain, unspecified: Secondary | ICD-10-CM | POA: Diagnosis not present

## 2020-02-09 DIAGNOSIS — N390 Urinary tract infection, site not specified: Secondary | ICD-10-CM | POA: Diagnosis not present

## 2020-02-09 DIAGNOSIS — R319 Hematuria, unspecified: Secondary | ICD-10-CM | POA: Diagnosis not present

## 2020-02-09 DIAGNOSIS — R310 Gross hematuria: Secondary | ICD-10-CM | POA: Diagnosis not present

## 2020-02-10 DIAGNOSIS — Z79899 Other long term (current) drug therapy: Secondary | ICD-10-CM | POA: Diagnosis not present

## 2020-02-10 DIAGNOSIS — H30033 Focal chorioretinal inflammation, peripheral, bilateral: Secondary | ICD-10-CM | POA: Diagnosis not present

## 2020-02-12 ENCOUNTER — Emergency Department (HOSPITAL_COMMUNITY)
Admission: EM | Admit: 2020-02-12 | Discharge: 2020-02-12 | Disposition: A | Discharge status: Home / Self Care | Payer: Medicare HMO | Attending: Emergency Medicine | Admitting: Emergency Medicine

## 2020-02-12 ENCOUNTER — Other Ambulatory Visit: Payer: Self-pay

## 2020-02-12 DIAGNOSIS — K5900 Constipation, unspecified: Secondary | ICD-10-CM | POA: Insufficient documentation

## 2020-02-12 DIAGNOSIS — I10 Essential (primary) hypertension: Secondary | ICD-10-CM | POA: Diagnosis not present

## 2020-02-12 DIAGNOSIS — R102 Pelvic and perineal pain: Secondary | ICD-10-CM | POA: Diagnosis not present

## 2020-02-12 LAB — URINALYSIS, ROUTINE W REFLEX MICROSCOPIC
Bilirubin Urine: NEGATIVE
Glucose, UA: NEGATIVE mg/dL
Hgb urine dipstick: NEGATIVE
Ketones, ur: 5 mg/dL — AB
Leukocytes,Ua: NEGATIVE
Nitrite: NEGATIVE
Protein, ur: NEGATIVE mg/dL
Specific Gravity, Urine: 1.021 (ref 1.005–1.030)
pH: 5 (ref 5.0–8.0)

## 2020-02-12 LAB — CBC
HCT: 47.3 % — ABNORMAL HIGH (ref 36.0–46.0)
Hemoglobin: 14.8 g/dL (ref 12.0–15.0)
MCH: 30.4 pg (ref 26.0–34.0)
MCHC: 31.3 g/dL (ref 30.0–36.0)
MCV: 97.1 fL (ref 80.0–100.0)
Platelets: 192 10*3/uL (ref 150–400)
RBC: 4.87 MIL/uL (ref 3.87–5.11)
RDW: 12.8 % (ref 11.5–15.5)
WBC: 6.4 10*3/uL (ref 4.0–10.5)
nRBC: 0 % (ref 0.0–0.2)

## 2020-02-12 LAB — LIPASE, BLOOD: Lipase: 33 U/L (ref 11–51)

## 2020-02-12 LAB — COMPREHENSIVE METABOLIC PANEL
ALT: 13 U/L (ref 0–44)
AST: 21 U/L (ref 15–41)
Albumin: 3.7 g/dL (ref 3.5–5.0)
Alkaline Phosphatase: 62 U/L (ref 38–126)
Anion gap: 10 (ref 5–15)
BUN: 19 mg/dL (ref 8–23)
CO2: 24 mmol/L (ref 22–32)
Calcium: 9.6 mg/dL (ref 8.9–10.3)
Chloride: 107 mmol/L (ref 98–111)
Creatinine, Ser: 1.04 mg/dL — ABNORMAL HIGH (ref 0.44–1.00)
GFR, Estimated: 57 mL/min — ABNORMAL LOW (ref >60)
Glucose, Bld: 109 mg/dL — ABNORMAL HIGH (ref 70–99)
Potassium: 4.1 mmol/L (ref 3.5–5.1)
Sodium: 141 mmol/L (ref 135–145)
Total Bilirubin: 0.8 mg/dL (ref 0.3–1.2)
Total Protein: 6.5 g/dL (ref 6.5–8.1)

## 2020-02-12 MED ORDER — DIAZEPAM 5 MG PO TABS: 5.0000 mg | ORAL_TABLET | Freq: Every evening | ORAL | 0 refills | Status: DC | PRN

## 2020-02-12 MED ORDER — FENTANYL CITRATE (PF) 100 MCG/2ML IJ SOLN
50.0000 ug | Freq: Once | INTRAMUSCULAR | Status: AC
  Administered 2020-02-12: 19:00:00 50 ug via INTRAMUSCULAR
  Filled 2020-02-12: qty 2

## 2020-02-12 NOTE — ED Triage Notes (Signed)
Pt here for eval of hypertension this morning. Reports systolic of 301. Not on medication for high blood pressure.

## 2020-02-12 NOTE — Discharge Instructions (Addendum)
Your laboratory work today was reassuring. Your urinalysis did not show any signs of infection. Your blood pressure was somewhat elevated but improved with pain medication. I recommend that you keep a log of your blood pressure at home for the next week and contact your primary doctor in regards to whether or not you need to be started on blood pressure medication if it remains persistently elevated.  I also recommend that you follow-up with your urologist and gastroenterologist in regards to your abdominal pain.  You are given a prescription for Valium to help with the symptoms.  Please take as directed.  Do not drive or drink alcohol while taking this medication as it can make you very sleepy.

## 2020-02-12 NOTE — ED Provider Notes (Signed)
[facility] EMERGENCY DEPARTMENT Provider Note   CSN: [id number] Arrival date & time: 02/12/20  1255     History Chief Complaint  Patient presents with  . Hypertension    Jordan Romero is a 69 y.o. female.  HPI    69 year old female presenting the emergency department today for evaluation of abdominal pain and elevated blood pressure.  Husband is at bedside and assist with the history.  He states that the patient has had suprapubic/pelvic pain for the last several months.  She is been following with urology and has a procedure scheduled in January for further evaluation of this.  She has a history of frequent UTIs but was recently treated for 1 and symptoms persist.  She has had no associated nausea vomiting or diarrhea.  She has had some constipation and is on a chronic stool softener.  She denies any fevers but continues to have some dysuria and frequency.  She also has nocturia. She reports urinary frequency and nocturia that has improved somewhat after being on medication. Denies dysuria or gross hematuria. Has been tx for uti but sxs persist. Has been seen by urology and is scheduled for a cystoscopy in Jan. She has been referred to GI, Dr Earlean Shawl.   Husband states that today the patient was complaining of pain more than normal and so he checked her vital signs and noted that her blood pressure and heart rate were elevated so he became worried and brought her to the ED as she does not have a history of hypertension   CT abd/pelvis 12/15/2019 - "1. No hydronephrosis, urolithiasis, or urothelial lesions identified. 2. Large hiatal hernia containing a majority of the stomach, the pancreatic tail, splenic vasculature. 3. Moderate to large colonic stool burden. 4. Cholelithiasis without evidence of cholecystitis. 5. Additional ancillary findings as above. "  No past medical history on file.  There are no problems to display for this patient.   No past surgical  history on file.   OB History   No obstetric history on file.     No family history on file.     Home Medications Prior to Admission medications   Medication Sig Start Date End Date Taking? Authorizing Provider  diazepam (VALIUM) 5 MG tablet Take 1 tablet (5 mg total) by mouth at bedtime as needed for anxiety (spasms). 02/12/20   Drenda Freeze, MD    Allergies    Patient has no known allergies.  Review of Systems   Review of Systems  Constitutional: Negative for chills and fever.  HENT: Negative for ear pain and sore throat.   Eyes: Negative for visual disturbance.  Respiratory: Negative for cough and shortness of breath.   Cardiovascular: Negative for chest pain.  Gastrointestinal: Positive for abdominal pain and constipation. Negative for diarrhea, nausea and vomiting.  Genitourinary: Negative for dysuria and hematuria.  Musculoskeletal: Negative for back pain.  Skin: Negative for rash.  Neurological: Negative for headaches.  All other systems reviewed and are negative.   Physical Exam Updated Vital Signs BP (!) 142/80   Pulse (!) 58   Temp 97.8 F (36.6 C) (Oral)   Resp 16   Ht 5\' 5"  (1.651 m)   Wt 81.6 kg   SpO2 99%   BMI 29.95 kg/m   Physical Exam Vitals and nursing note reviewed.  Constitutional:      General: She is not in acute distress.    Appearance: She is well-developed and well-nourished.  HENT:  Head: Normocephalic and atraumatic.  Eyes:     Conjunctiva/sclera: Conjunctivae normal.  Cardiovascular:     Rate and Rhythm: Normal rate and regular rhythm.     Heart sounds: Normal heart sounds. No murmur heard.   Pulmonary:     Effort: Pulmonary effort is normal. No respiratory distress.     Breath sounds: Normal breath sounds. No wheezing, rhonchi or rales.  Abdominal:     General: Bowel sounds are normal.     Palpations: Abdomen is soft.     Tenderness: There is no abdominal tenderness. There is no guarding or rebound.   Musculoskeletal:        General: No edema.     Cervical back: Neck supple.  Skin:    General: Skin is warm and dry.  Neurological:     Mental Status: She is alert.  Psychiatric:        Mood and Affect: Mood and affect normal.     ED Results / Procedures / Treatments   Labs (all labs ordered are listed, but only abnormal results are displayed) Labs Reviewed  COMPREHENSIVE METABOLIC PANEL - Abnormal; Notable for the following components:      Result Value   Glucose, Bld 109 (*)    Creatinine, Ser 1.04 (*)    GFR, Estimated 57 (*)    All other components within normal limits  CBC - Abnormal; Notable for the following components:   HCT 47.3 (*)    All other components within normal limits  URINALYSIS, ROUTINE W REFLEX MICROSCOPIC - Abnormal; Notable for the following components:   APPearance HAZY (*)    Ketones, ur 5 (*)    All other components within normal limits  LIPASE, BLOOD    EKG None  Radiology No results found.  Procedures Procedures (including critical care time)  Medications Ordered in ED Medications  fentaNYL (SUBLIMAZE) injection 50 mcg (50 mcg Intramuscular Given 02/12/20 1837)    ED Course  I have reviewed the triage vital signs and the nursing notes.  Pertinent labs & imaging results that were available during my care of the patient were reviewed by me and considered in my medical decision making (see chart for details).    MDM Rules/Calculators/A&P                          69 year old female presenting the emergency department today for evaluation of suprapubic pain and hypertension.  Suprapubic pain is chronic in nature and following with urology for this.  Also has seen GI in the past.  Has been concern for elevated blood pressure as she does not have history of this.  Patient does not have any chest pain, shortness of breath, neurologic complaints.  Her blood pressure was initially elevated however after treatment with some pain medication her  blood pressure did improve in the emergency department.  Given that she has no known history of this is not currently on medication, advised to keep a log of blood pressures at home and bring this to her PCP appointment to see whether or not they want to start her on medication at that time.  With her chronic suprapubic pain, unclear diagnosis at this time but her labs are reassuring and her urinalysis is negative for UTI.  She will likely need further work-up as an outpatient with either urology and or GI but in the meantime we will give her some medication to help with her symptoms.  She advised on  return precautions.   Patient seen in conjunction with supervising physician, Dr. Darl Householder who personally evaluated the patient and agree with the plan.  Final Clinical Impression(s) / ED Diagnoses Final diagnoses:  Hypertension, unspecified type  Pelvic pain    Rx / DC Orders ED Discharge Orders         Ordered    diazepam (VALIUM) 5 MG tablet  At bedtime PRN        02/12/20 1958           Tabitha Tupper, [place], PA-C 02/12/20 2009    Drenda Freeze, MD 02/13/20 1452

## 2020-02-14 DIAGNOSIS — R1030 Lower abdominal pain, unspecified: Secondary | ICD-10-CM | POA: Diagnosis not present

## 2020-02-14 DIAGNOSIS — R030 Elevated blood-pressure reading, without diagnosis of hypertension: Secondary | ICD-10-CM | POA: Diagnosis not present

## 2020-02-14 DIAGNOSIS — M792 Neuralgia and neuritis, unspecified: Secondary | ICD-10-CM | POA: Diagnosis not present

## 2020-02-14 DIAGNOSIS — R413 Other amnesia: Secondary | ICD-10-CM | POA: Diagnosis not present

## 2020-02-16 DIAGNOSIS — Z01419 Encounter for gynecological examination (general) (routine) without abnormal findings: Secondary | ICD-10-CM | POA: Diagnosis not present

## 2020-02-16 DIAGNOSIS — Z6837 Body mass index (BMI) 37.0-37.9, adult: Secondary | ICD-10-CM | POA: Diagnosis not present

## 2020-02-16 DIAGNOSIS — R102 Pelvic and perineal pain: Secondary | ICD-10-CM | POA: Diagnosis not present

## 2020-02-18 DIAGNOSIS — Z86010 Personal history of colonic polyps: Secondary | ICD-10-CM | POA: Diagnosis not present

## 2020-02-18 DIAGNOSIS — R102 Pelvic and perineal pain: Secondary | ICD-10-CM | POA: Diagnosis not present

## 2020-02-18 DIAGNOSIS — K5900 Constipation, unspecified: Secondary | ICD-10-CM | POA: Diagnosis not present

## 2020-02-18 DIAGNOSIS — Z800 Family history of malignant neoplasm of digestive organs: Secondary | ICD-10-CM | POA: Diagnosis not present

## 2020-02-18 DIAGNOSIS — K8020 Calculus of gallbladder without cholecystitis without obstruction: Secondary | ICD-10-CM | POA: Diagnosis not present

## 2020-02-18 DIAGNOSIS — N390 Urinary tract infection, site not specified: Secondary | ICD-10-CM | POA: Diagnosis not present

## 2020-02-22 ENCOUNTER — Encounter: Payer: Self-pay | Admitting: Neurology

## 2020-02-23 DIAGNOSIS — Z800 Family history of malignant neoplasm of digestive organs: Secondary | ICD-10-CM | POA: Diagnosis not present

## 2020-02-23 DIAGNOSIS — Z86010 Personal history of colonic polyps: Secondary | ICD-10-CM | POA: Diagnosis not present

## 2020-03-10 DIAGNOSIS — R102 Pelvic and perineal pain: Secondary | ICD-10-CM | POA: Diagnosis not present

## 2020-04-13 DIAGNOSIS — Z86010 Personal history of colonic polyps: Secondary | ICD-10-CM | POA: Diagnosis not present

## 2020-04-13 DIAGNOSIS — K5900 Constipation, unspecified: Secondary | ICD-10-CM | POA: Diagnosis not present

## 2020-04-13 DIAGNOSIS — R109 Unspecified abdominal pain: Secondary | ICD-10-CM | POA: Diagnosis not present

## 2020-05-02 DIAGNOSIS — H44113 Panuveitis, bilateral: Secondary | ICD-10-CM | POA: Diagnosis not present

## 2020-05-02 DIAGNOSIS — B078 Other viral warts: Secondary | ICD-10-CM | POA: Diagnosis not present

## 2020-05-02 DIAGNOSIS — L821 Other seborrheic keratosis: Secondary | ICD-10-CM | POA: Diagnosis not present

## 2020-05-02 DIAGNOSIS — D225 Melanocytic nevi of trunk: Secondary | ICD-10-CM | POA: Diagnosis not present

## 2020-05-02 DIAGNOSIS — H3581 Retinal edema: Secondary | ICD-10-CM | POA: Diagnosis not present

## 2020-05-02 DIAGNOSIS — H30033 Focal chorioretinal inflammation, peripheral, bilateral: Secondary | ICD-10-CM | POA: Diagnosis not present

## 2020-05-02 DIAGNOSIS — H30103 Unspecified disseminated chorioretinal inflammation, bilateral: Secondary | ICD-10-CM | POA: Diagnosis not present

## 2020-05-02 DIAGNOSIS — Z961 Presence of intraocular lens: Secondary | ICD-10-CM | POA: Diagnosis not present

## 2020-05-02 DIAGNOSIS — D692 Other nonthrombocytopenic purpura: Secondary | ICD-10-CM | POA: Diagnosis not present

## 2020-05-02 DIAGNOSIS — Z79899 Other long term (current) drug therapy: Secondary | ICD-10-CM | POA: Diagnosis not present

## 2020-05-02 DIAGNOSIS — L814 Other melanin hyperpigmentation: Secondary | ICD-10-CM | POA: Diagnosis not present

## 2020-05-04 ENCOUNTER — Encounter: Payer: Self-pay | Admitting: Neurology

## 2020-05-04 ENCOUNTER — Ambulatory Visit: Payer: Medicare HMO | Admitting: Neurology

## 2020-05-04 ENCOUNTER — Other Ambulatory Visit: Payer: Self-pay

## 2020-05-04 VITALS — BP 114/66 | HR 66 | Ht 65.0 in | Wt 212.0 lb

## 2020-05-04 DIAGNOSIS — R413 Other amnesia: Secondary | ICD-10-CM | POA: Diagnosis not present

## 2020-05-04 DIAGNOSIS — R109 Unspecified abdominal pain: Secondary | ICD-10-CM

## 2020-05-04 NOTE — Progress Notes (Addendum)
NEUROLOGY CONSULTATION NOTE  Jordan Romero MRN: [id number] DOB: Jul 07, 1950  Referring provider: Dr. London Pepper Primary care provider: Dr. London Pepper  Reason for consult:  Memory loss  Dear Dr Orland Mustard:  Thank you for your kind referral of Jordan Romero for consultation of the above symptoms. Although her history is well known to you, please allow me to reiterate it for the purpose of our medical record. The patient was alone during the visit, I spoke to her husband separately prior to the visit for additional information. Records and images were personally reviewed where available.   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 Her husband provided additional history prior to the visit and states that the patient thinks this visit is for her abdominal pain, however he had expressed concerns about memory to Dr. Orland Mustard. She states she thought the reason she was referred was for the tightness in her lower abdomen. We discussed her abdominal pain, she reports a tightness in her lower abdominal region that has been ongoing for at least a year. She states it is there all the time, but is not present today. She may take an Ibuprofen which would help. She was tried on Valium in the past which did help, "but no one would prescribe it for me." There is no associated nausea/vomiting, vaginal symptoms, bowel/bladder dysfunction, back pain, focal numbness/tingling/weakness in her extremities.   She states that her memory has never been that good, and keeps repeating "I think it's more of an attention issue." She states her memory is not terrible but she is somewhat forgetful. 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 She lives with her husband. Her hsuband started  noticing changes 5-6 years ago. He states they would have the same conversation multiple times, asking the same question repeatedly. He states she either has a "total lack of memory or total attention deficit disorder, can't pay attention." She manages her own medications and denies missing any doses. Her husband reported she may get disoriented when she is out of routine when they travel. She stopped driving a year ago due to her vision, she has less vision on her left eye, difficulty with peripheral vision on both eyes. Her husband notes she would get lost driving, she would make a wrong turn from her daughter's house and come home an hour later. There were several dents on the car. Her husband manages finances. She forgets to turn off the stove or oven, she admits to doing this yesterday but states this is not frequent. She misplaces things, her husband reports she would unload the dishwasher and leave all the cabinets open. She denies any word-finding difficulties. Her husband reports there were a couple of nights that she turns on the lights not knowing where she was, she said she was downstairs when they were in the bedroom. No paranoia or hallucinations. No significant personality changes, her husband notes when she misses Cymbalta she is more irritable and argumentative. She states her mood is fine. When their daughter got married in 2020, she was not interacting with the wedding party. She was found to have earwax and got hearing aids, but only wants to wear them when she watches TV at night. Sleep is good, no daytime drowsiness. Her mother had Alzheimer's disease in her 32s.  She drinks a glass of wine every night. No significant head injuries.   She denies any headaches, dizziness, diplopia, dysarthria, dysphagia, neck/back pain, focal numbness/tingling/weakness, bowel/bladder dysfunction. No anosmia, tremors, no falls. She endorses attention issues when younger, but none in college. She recalls her  husband making her do a cognitive evaluation in [place] many years ago while she was still working full time, she does not recall results but they were "mixed results" and again states "I do have attention issues, I know that." She takes a daily B12 supplement for low B12 level.    PAST MEDICAL HISTORY: Past Medical History:  Diagnosis Date  . Hypertension   . Hypothyroid     PAST SURGICAL HISTORY: History reviewed. No pertinent surgical history.  MEDICATIONS: Current Outpatient Medications on File Prior to Visit  Medication Sig Dispense Refill  . azaTHIOprine (IMURAN) 50 MG tablet     . cycloSPORINE modified (NEORAL) 50 MG capsule cyclosporine modified 50 mg capsule    . dorzolamide-timolol (COSOPT) 22.3-6.8 MG/ML ophthalmic solution dorzolamide 22.3 mg-timolol 6.8 mg/mL eye drops    . DULoxetine (CYMBALTA) 20 MG capsule Take by mouth.    . DULoxetine (CYMBALTA) 60 MG capsule duloxetine 60 mg capsule,delayed release    . folic acid (FOLVITE) 1 MG tablet Take by mouth.    . levothyroxine (SYNTHROID) 75 MCG tablet     . diazepam (VALIUM) 5 MG tablet Take 1 tablet (5 mg total) by mouth at bedtime as needed for anxiety (spasms). (Patient not taking: Reported on 05/04/2020) 10 tablet 0   No current facility-administered medications on file prior to visit.    ALLERGIES: No Known Allergies  FAMILY HISTORY: History reviewed. No pertinent family history.  SOCIAL HISTORY: Social History   Socioeconomic History  . Marital status: Married    Spouse name: Not on file  . Number of children: Not on file  . Years of education: Not on file  . Highest education level: Not on file  Occupational History  . Not on file  Tobacco Use  . Smoking status: Not on file  . Smokeless tobacco: Not on file  Substance and Sexual Activity  . Alcohol use: Not on file  . Drug use: Not on file  . Sexual activity: Not on file  Other Topics Concern  . Not on file  Social History Narrative  . Not on  file   Social Determinants of Health   Financial Resource Strain: Not on file  Food Insecurity: Not on file  Transportation Needs: Not on file  Physical Activity: Not on file  Stress: Not on file  Social Connections: Not on file  Intimate Partner Violence: Not on file     PHYSICAL EXAM: Vitals:   05/04/20 0900  BP: 114/66  Pulse: 66  SpO2: 99%   General: No acute distress Head:  Normocephalic/atraumatic Skin/Extremities: No rash, no edema Abdominal exam: soft, non-tender, no distension. There is crusted brown skin in the lower abdominal folds. She reports a "ticklish" sensation to pinprick on the mid-lower abdominal region, no clear sensory level Neurological Exam: Mental status: alert and oriented to person, place, states it is February 2021. No dysarthria or aphasia, Fund of knowledge is appropriate.  Recent and remote memory are impaired.  Attention and concentration are reduced.  Able to name objects and repeat phrases. [facility] score 16/30 Montreal Cognitive Assessment  05/04/2020  Visuospatial/ Executive (0/5) 3  Naming (0/3) 3  Attention: Read list of digits (0/2) 2  Attention: Read list  of letters (0/1) 1  Attention: Serial 7 subtraction starting at 100 (0/3) 1  Language: Repeat phrase (0/2) 2  Language : Fluency (0/1) 0  Abstraction (0/2) 2  Delayed Recall (0/5) 0  Orientation (0/6) 2  Total 16    Cranial nerves: CN I: not tested CN II: pupils equal, round and reactive to light, restricted bilateral upper visual fields and right lower to double simultaneous stimulation. CN III, IV, VI:  full range of motion, no nystagmus, no ptosis CN V: facial sensation intact CN VII: upper and lower face symmetric CN VIII: hearing intact to conversation CN XI: sternocleidomastoid and trapezius muscles intact CN XII: tongue midline Bulk & Tone: normal, no fasciculations. Motor: 5/5 throughout with no pronator drift. Sensation: intact to light touch, cold, pin, vibration sense.   No extinction to double simultaneous stimulation.  Romberg test negative Deep Tendon Reflexes: +2 throughout Cerebellar: no incoordination on finger to nose testing Gait: narrow-based and steady, able to tandem walk adequately. 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 Her husband has been concerned about her memory and requested Neurology referral with patient thinking she is here for abdominal tightness. Exam does not show any neurological cause for her abdominal symptoms, findings discussed with patient. Discussed concerns with MOCA of 16/30. Her husband separately disclosed memory issues raising concern for dementia. She is agreeable to doing an MRI brain without contrast to assess for underlying structural abnormality. She feels her memory issues are due to attention deficit, she will be scheduled for Neuropsychological evaluation to further evaluate cognitive concerns. Follow-up after testing, she knows to call for any changes.    Thank you for allowing me to participate in the care of this patient. Please do not hesitate to call for any questions or concerns.   Ellouise Newer, M.D.  CC: Dr. Orland Mustard

## 2020-05-04 NOTE — Patient Instructions (Signed)
Good to meet you.  1. Try stretching exercises for lower abdominal spasms/pain  2. Schedule MRI brain without contrast  3. Schedule Neurocognitive testing  4. Follow-up after tests, call for any changes   You have been referred for a neuropsychological evaluation (i.e., evaluation of memory and thinking abilities). Please bring someone with you to this appointment if possible, as it is helpful for the doctor to hear from both you and another adult who knows you well. Please bring eyeglasses and hearing aids if you wear them.    The evaluation will take approximately 3 hours and has two parts:   . The first part is a clinical interview with the neuropsychologist (Dr. Melvyn Novas or Dr. Nicole Kindred). During the interview, the neuropsychologist will speak with you and the individual you brought to the appointment.    . The second part of the evaluation is testing with the doctor's technician Hinton Dyer or Maudie Mercury). During the testing, the technician will ask you to remember different types of material, solve problems, and answer some questionnaires. Your family member will not be present for this portion of the evaluation.   Please note: We must reserve several hours of the neuropsychologist's time and the psychometrician's time for your evaluation appointment. As such, there is a No-Show fee of $100. If you are unable to attend any of your appointments, please contact our office as soon as possible to reschedule.

## 2020-05-12 ENCOUNTER — Ambulatory Visit
Admission: RE | Admit: 2020-05-12 | Discharge: 2020-05-12 | Disposition: A | Discharge status: Home / Self Care | Payer: Medicare HMO | Source: Ambulatory Visit | Attending: Neurology | Admitting: Neurology

## 2020-05-12 ENCOUNTER — Other Ambulatory Visit: Payer: Self-pay

## 2020-05-12 DIAGNOSIS — R413 Other amnesia: Secondary | ICD-10-CM

## 2020-05-12 DIAGNOSIS — R109 Unspecified abdominal pain: Secondary | ICD-10-CM

## 2020-05-16 NOTE — Progress Notes (Signed)
Pt advised of her Mri results and to keep schedule memory test 07/10/20

## 2020-06-30 ENCOUNTER — Ambulatory Visit
Admission: RE | Admit: 2020-06-30 | Discharge: 2020-06-30 | Disposition: A | Discharge status: Home / Self Care | Payer: Medicare HMO | Source: Ambulatory Visit | Attending: Gastroenterology | Admitting: Gastroenterology

## 2020-06-30 ENCOUNTER — Other Ambulatory Visit: Payer: Self-pay | Admitting: Gastroenterology

## 2020-06-30 ENCOUNTER — Other Ambulatory Visit: Payer: Self-pay

## 2020-06-30 DIAGNOSIS — K5900 Constipation, unspecified: Secondary | ICD-10-CM

## 2020-06-30 DIAGNOSIS — R140 Abdominal distension (gaseous): Secondary | ICD-10-CM

## 2020-06-30 DIAGNOSIS — K449 Diaphragmatic hernia without obstruction or gangrene: Secondary | ICD-10-CM | POA: Diagnosis not present

## 2020-07-04 DIAGNOSIS — M25561 Pain in right knee: Secondary | ICD-10-CM | POA: Insufficient documentation

## 2020-07-04 DIAGNOSIS — M25562 Pain in left knee: Secondary | ICD-10-CM

## 2020-07-04 HISTORY — DX: Pain in left knee: M25.561

## 2020-07-04 HISTORY — DX: Pain in right knee: M25.562

## 2020-07-05 DIAGNOSIS — M1712 Unilateral primary osteoarthritis, left knee: Secondary | ICD-10-CM | POA: Diagnosis not present

## 2020-07-10 ENCOUNTER — Ambulatory Visit (INDEPENDENT_AMBULATORY_CARE_PROVIDER_SITE_OTHER): Payer: Medicare HMO | Admitting: Psychology

## 2020-07-10 ENCOUNTER — Ambulatory Visit: Payer: Medicare HMO | Admitting: Psychology

## 2020-07-10 ENCOUNTER — Encounter: Payer: Self-pay | Admitting: Psychology

## 2020-07-10 ENCOUNTER — Other Ambulatory Visit: Payer: Self-pay

## 2020-07-10 DIAGNOSIS — G4700 Insomnia, unspecified: Secondary | ICD-10-CM | POA: Insufficient documentation

## 2020-07-10 DIAGNOSIS — G3184 Mild cognitive impairment, so stated: Secondary | ICD-10-CM | POA: Diagnosis not present

## 2020-07-10 DIAGNOSIS — Z860100 Personal history of colon polyps, unspecified: Secondary | ICD-10-CM | POA: Insufficient documentation

## 2020-07-10 DIAGNOSIS — N390 Urinary tract infection, site not specified: Secondary | ICD-10-CM | POA: Insufficient documentation

## 2020-07-10 DIAGNOSIS — I10 Essential (primary) hypertension: Secondary | ICD-10-CM | POA: Insufficient documentation

## 2020-07-10 DIAGNOSIS — R4189 Other symptoms and signs involving cognitive functions and awareness: Secondary | ICD-10-CM

## 2020-07-10 DIAGNOSIS — E785 Hyperlipidemia, unspecified: Secondary | ICD-10-CM | POA: Insufficient documentation

## 2020-07-10 DIAGNOSIS — Z800 Family history of malignant neoplasm of digestive organs: Secondary | ICD-10-CM | POA: Insufficient documentation

## 2020-07-10 DIAGNOSIS — K5900 Constipation, unspecified: Secondary | ICD-10-CM | POA: Insufficient documentation

## 2020-07-10 DIAGNOSIS — Q159 Congenital malformation of eye, unspecified: Secondary | ICD-10-CM | POA: Insufficient documentation

## 2020-07-10 DIAGNOSIS — K8020 Calculus of gallbladder without cholecystitis without obstruction: Secondary | ICD-10-CM | POA: Insufficient documentation

## 2020-07-10 DIAGNOSIS — R102 Pelvic and perineal pain: Secondary | ICD-10-CM | POA: Insufficient documentation

## 2020-07-10 HISTORY — DX: Family history of malignant neoplasm of digestive organs: Z80.0

## 2020-07-10 HISTORY — DX: Mild cognitive impairment of uncertain or unknown etiology: G31.84

## 2020-07-10 NOTE — Progress Notes (Signed)
NEUROPSYCHOLOGICAL EVALUATION Jordan Romero. [facility] Department of Neurology  Date of Evaluation: Jul 10, 2020  Reason for Referral:   Jordan Romero is a 70 y.o. right-handed female referred by Ellouise Newer, M.D., to characterize her current cognitive functioning and assist with diagnostic clarity and treatment planning in the context of subjective cognitive decline.   Assessment and Plan:   Clinical Impression(s): Jordan Romero' pattern of performance is suggestive of a primary impairment surrounding learning and memory, especially retrieval/consolidation aspects of memory. Performance variability was further exhibited across processing speed, executive functioning (with reasoning and safety/judgment abilities intact), and semantic fluency; however, the former may be impacted by visual acuity concerns. Performance was appropriate relative to age-matched peers across attention/concentration, receptive language, phonemic fluency, confrontation naming, and visuospatial abilities. Jordan Romero generally denied difficulties completing instrumental activities of daily living (ADLs) independently. Her husband did not strongly contradict this. 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  The etiology for memory impairment and other cognitive dysfunction is unclear. Specific to memory, Jordan Romero was fully amnestic across all three memory measures. Furthermore, while performances on yes/no recognitions items were not fully suggestive of rapid forgetting, they were below expectation and concerning for an evolving memory storage deficit. Combined, this pattern of memory dysfunction is concerning for early stages of Alzheimer's disease. Variability across semantic fluency and executive functioning in addition to memory dysfunction could also suggest early stages of  this illness. Overall, continued medical monitoring will be important moving forward to assess further cognitive decline over time. Neuroimaging did not suggest prominent vascular changes to warrant consideration of a primary vascular etiology. I also do not see compelling evidence for Lewy body or frontotemporal dementia at the present time.   Recommendations: A repeat neuropsychological evaluation in 18-24 months (or sooner if functional decline is noted) is recommended to assess the trajectory of future cognitive decline should it occur. This will also aid in future efforts towards improved diagnostic clarity.  Jordan Romero may wish to discuss medication options with Dr. Delice Lesch regarding ongoing memory loss. It is important to highlight that these medications have been shown to slow functional decline in some individuals. Unfortunately, if Alzheimer's disease is indeed present, there is no current treatment which can stop or reverse cognitive decline.   Should there be a progression of her current deficits over time, Jordan Romero is unlikely to regain any independent living skills lost. Therefore, it is recommended that she remain as involved as possible in all aspects of household chores, finances, and medication management, with supervision to ensure adequate performance. She will likely benefit from the establishment and maintenance of a routine in order to maximize her functional abilities over time.  It will be important for Jordan Romero to have another person with her when in situations where she may need to process information, weigh the pros and cons of different options, and make decisions, in order to ensure that she fully understands and recalls all information to be considered.  If not already done, Jordan Romero and her family may want to discuss her wishes regarding durable power of attorney and medical decision making, so that she can have input into these choices. Additionally, they may wish to  discuss future plans for caretaking and seek out community options for in home/residential care should they become necessary.  Important information to remember should be provided in written formats in  all instances. This should be placed in a highly visible and commonly frequented location of her residence to help promote recall.   Jordan Romero is encouraged to attend to lifestyle factors for brain health (e.g., regular physical exercise, good nutrition habits, regular participation in cognitively-stimulating activities, and general stress management techniques), which are likely to have benefits for both emotional adjustment and cognition.  To address problems with processing speed, she may wish to consider:   -Ensuring that she is alerted when essential material or instructions are being presented   -Adjusting the speed at which new information is presented   -Allowing for more time in comprehending, processing, and responding in conversation  To address problems with executive functioning, she may wish to consider:   -Avoiding external distractions when needing to concentrate   -Limiting exposure to fast paced environments with multiple sensory demands   -Writing down complicated information and using checklists   -Attempting and completing one task at a time (i.e., no multi-tasking)   -Verbalizing aloud each step of a task to maintain focus   -Reducing the amount of information considered at one time  Review of Records:   Jordan Romero was seen by [facility] Marland KitchenEllouise Newer, M.D.) on 05/04/2020 for an evaluation of memory loss. Jordan Romero stated her belief that her memory has never been that good and was noted to repeat "I think it's more of an attention issue" several times throughout this visit. She did report misplacing things and denied word-finding issues. Her husband started noticing changes 5-6 years ago. He stated they would have the same conversation multiple times and that she would  ask the same question repeatedly. He noted that she either has a "total lack of memory or total attention deficit disorder and can't pay attention." Jordan Romero manages her own medications and denied missing any doses. Her husband reported that she may get disoriented when she is out of routine when they travel. She stopped driving a year ago due to vision difficulties. She has reduced vision in her left eye and difficulty with peripheral vision in both eyes. Her husband described a remote instance where she seemingly got lost while driving, making a wrong turn from her daughter's house and coming home an hour later. There were also said to be several dents in the car. Her husband manages finances. No paranoia, hallucinations, or significant personality changes were reported. However, her husband did note that when she misses her Cymbalta dose, she is more irritable and argumentative. She denied acute mood concerns. Sleep was described as good and no significant head injuries were reported. She also denied any headaches, dizziness, diplopia, dysarthria, dysphagia, neck/back pain, focal numbness/tingling/weakness, bowel/bladder dysfunction, anosmia, tremors, or recent falls. Performance on a brief cognitive screening instrument (MOCA) was 16/30. Ultimately, Ms. Gutknecht was referred for a comprehensive neuropsychological evaluation to characterize her cognitive abilities and to assist with diagnostic clarity and treatment planning.   Brain MRI on 05/12/2020 suggested that cerebral volume was age-appropriate and small vessel ischemic changes were minimal for her age.   Past Medical History:  Diagnosis Date  . Acquired hypothyroidism 04/28/2013  . Cholelithiasis   . Congenital anomaly of eye   . 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 Hiatal hernia 02/14/2016   Noted on imaging by chiropractor  . History of colonic polyps   . Hyperlipidemia   . Hypertension    . Insomnia   . Knee pain, bilateral 07/04/2020  . Major  depressive disorder in remission 07/14/2013  . Pain in pelvis   . 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 Pseudophakia of both eyes 12/19/2017  . Recurrent urinary tract infection   . 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   Current Outpatient Medications:  .  azaTHIOprine (IMURAN) 50 MG tablet, , Disp: , Rfl:  .  cycloSPORINE modified (NEORAL) 50 MG capsule, cyclosporine modified 50 mg capsule, Disp: , Rfl:  .  diazepam (VALIUM) 5 MG tablet, Take 1 tablet (5 mg total) by mouth at bedtime as needed for anxiety (spasms). (Patient not taking: Reported on 05/04/2020), Disp: 10 tablet, Rfl: 0 .  dorzolamide-timolol (COSOPT) 22.3-6.8 MG/ML ophthalmic solution, dorzolamide 22.3 mg-timolol 6.8 mg/mL eye drops, Disp: , Rfl:  .  DULoxetine (CYMBALTA) 20 MG capsule, Take by mouth., Disp: , Rfl:  .  DULoxetine (CYMBALTA) 60 MG capsule, duloxetine 60 mg capsule,delayed release, Disp: , Rfl:  .  folic acid (FOLVITE) 1 MG tablet, Take by mouth., Disp: , Rfl:  .  levothyroxine (SYNTHROID) 75 MCG tablet, , Disp: , Rfl:   Clinical Interview:   The following information was obtained during a clinical interview with Ms. Barro and her husband prior to cognitive testing.  Cognitive Symptoms: Decreased short-term memory: Denied. While Ms. Hiser did acknowledge that family members have concerns surrounding her memory, she generally denied acute concerns held by herself outside of her occasionally misplacing things around her residence. Her husband expressed greater concerns, stating that she seems to forget conversations minutes after they have occurred. He also noted that she will frequently repeat herself or ask repetitive questions and misplace things. He provided an example of her pulling out a Mother's Day card from her daughter from her purse and being surprised as if it were  the first time she had seen it. Concerns surrounding memory were said to be present since around 2014 and have progressively worsened over time.  Decreased long-term memory: Denied. Decreased attention/concentration: Endorsed. She acknowledged a longstanding pattern of mild attention deficits going back all the way to early childhood. She denied ever being tested for or diagnosed with ADHD in the past. She did state that these difficulties impacted her academic performance to a mild extent up until she went to college.  Reduced processing speed: Denied. Her husband alluded to some trouble processing new information and understanding "logical next steps." He provided an example of where Ms. Glasco remained in a parked, hot car while he and their daughter went inside a gas station. Rather than opening the car door to cool down, she remained inside the hot car and was upset at them for allowing her to overheat upon their return.  Difficulties with executive functions: Denied. Difficulties with emotion regulation: Denied. Difficulties with receptive language: Denied. Difficulties with word finding: Denied. Decreased visuoperceptual ability: Endorsed. However, this was attributed to significant visual acuity concerns.   Difficulties completing ADLs: Generally denied. Ms. Hipke reported managing her medications independently. Her husband agreed with this, stating that she may occasionally have trouble remembering eye drops but otherwise does a good job. Her husband manages finances which is longstanding in nature. She does not currently drive due to vision-related difficulties. However, records from Dr. Delice Lesch do suggest that she has had some instances where she may leave the stove on.   Additional Medical History: History of traumatic brain injury/concussion: Denied. History of stroke: Denied. History of seizure activity: Denied. History of known exposure  to toxins: Denied. Symptoms of chronic pain:  Medical records suggest prior bilateral knee pain, pelvis pain, and abdominal pain.  Experience of frequent headaches/migraines: Denied. Frequent instances of dizziness/vertigo: Denied.  Sensory changes: She reported semi-longstanding visual acuity deficits. Her and her husband noted that she essentially cannot see out of her left eye and has notable trouble with peripheral vision bilaterally. She is able to read; however, she generally reads on a tablet or electronic reading device where she can enlarge the print. She also utilizes hearing aids with positive effect. Other sensory changes/difficulties (e.g., taste or smell) were denied.  Balance/coordination difficulties: Denied. She also denied any recent falls.  Other motor difficulties: Denied.  Sleep History: Estimated hours obtained each night: 7 hours.  Difficulties falling asleep: Denied. Medical records do suggest prior concerns surrounding insomnia.  Difficulties staying asleep: Endorsed variably. She described occasionally waking throughout the night to use the restroom. Feels rested and refreshed upon awakening: She reported feeling "fairly rested" upon awakening.   History of snoring: Endorsed. History of waking up gasping for air: Denied. Witnessed breath cessation while asleep: Denied.  History of vivid dreaming: Denied. Excessive movement while asleep: Denied. Instances of acting out her dreams: Denied.  Psychiatric/Behavioral Health History: Depression: She acknowledged a somewhat remote history of mild depressive symptoms, consistent with medical records. She currently takes Cymbalta with positive effect. She noted that she stopped this medication at one point but her husband stated that she functioned better while taking it so she resumed. Acutely, she described her mood as "fine" and did not feel as though depressive symptoms were playing an active role in her presentation.  Anxiety: Denied. However, acutely, she did  acknowledge some concerns surrounding the upcoming testing procedures. She also expressed concerns surrounding cognitive/memory decline given her mother's history of developing Alzheimer's disease while in her 33s.  Mania: Denied. Trauma History: Denied. Visual/auditory hallucinations: Denied. Delusional thoughts: Denied.  Tobacco: Denied. She quit in the past. Alcohol: She reported consuming an occasional glass of wine with dinner. She denied a history of problematic alcohol abuse or dependence.  Recreational drugs: Denied. Caffeine: 1-2 cups of coffee in the morning.   Family History: Problem Relation Age of Onset  . Alzheimer's disease Mother        symptom onset in 4s   This information was confirmed by Jordan Romero.  Academic/Vocational History: Highest level of educational attainment: 18 years. 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 She described herself as a good (A/B) student in academic settings. Math was noted as a likely relative weakness.  History of developmental delay: Denied. History of grade repetition: Denied. Enrollment in special education courses: Denied. History of LD/ADHD: Denied. However, she did state that mild attentional deficits were longstanding in nature dating back to early childhood before becoming less apparent while in college.   Employment: Retired. She previously worked as a Transport planner.   Evaluation Results:   Behavioral Observations: Jordan Romero was accompanied by her husband, arrived to her appointment on time, and was appropriately dressed and groomed. She appeared alert and oriented. Observed gait and station were within normal limits. Gross motor functioning appeared intact upon informal observation and no abnormal movements (e.g., tremors) were noted. Her affect was generally relaxed and positive. Her husband stated that she was "mad as a hornet being here for  testing." She did not appear mad/angry but did acknowledge some anxiety surrounding the testing process and family  medical history. Spontaneous speech was fluent and word finding difficulties were not observed during the clinical interview. Thought processes were coherent, organized, and normal in content. Insight into her cognitive difficulties appeared limited surrounding memory dysfunction as objective testing revealed far greater dysfunction than what she reported during interview. She did appear to have some realization of this however. During a cognitive screening instrument (SLUMS), she commented "maybe there is something wrong with my memory."   During testing, sustained attention was appropriate. Task engagement was adequate and she persisted when challenged. While preparation was made to avoid small print or other visual stimuli with small figures as necessary, Ms. Dobrowolski generally did not have difficulty performing these tasks unaltered. The only vision-related issue was across the D-KEFS Color Word task where she seemed to have occasional trouble differentiating green from blue. Instructions also had to be repeated several times during this task to ensure adequate comprehension. Overall, Ms. Cape was cooperative with the clinical interview and subsequent testing procedures.   Adequacy of Effort: The validity of neuropsychological testing is limited by the extent to which the individual being tested may be assumed to have exerted adequate effort during testing. Jordan Romero expressed her intention to perform to the best of her abilities and exhibited adequate task engagement and persistence. Scores across stand-alone and embedded performance validity measures were within expectation. As such, the results of the current evaluation are believed to be a valid representation of Ms. Rosevear' current cognitive functioning.  Test Results: Jordan Romero was somewhat disoriented to time of the current  evaluation. She incorrectly stated the current month ("April") and was unable to estimate the current date or day of the week. She also was about two hours off when estimating the current time.   Intellectual abilities based upon educational and vocational attainment were estimated to be in the average to above average range. Premorbid abilities were estimated to be within the average range based upon a single-word reading test.   Processing speed was exceptionally low to below average, potentially impacted by visual acuity difficulties. Basic attention was average. More complex attention (e.g., working memory) was also average. Executive functioning was variable. Performances across tasks assessing cognitive flexibility and response inhibition were generally exceptionally low to well below average. However, she did perform in the average range across two tasks assessing reasoning abilities. She also performed in the well above average range on a task assessing safety and judgment.  Assessed receptive language abilities were average. Likewise, Jordan Romero answered all questions asked of her appropriately. Assessed expressive language (e.g., verbal fluency and confrontation naming) was variable. Phonemic fluency was below average to average, semantic fluency was exceptionally low to below average, and confrontation naming was average.     Assessed visuospatial/visuoconstructional abilities were below average to average. Points were lost on her drawing of a clock due to incorrect hand placement. Points were lost on her copy of a complex figure due to her omitting one aspect entirely and placing another in an incorrect location.     Learning (i.e., encoding) of novel verbal information was well below average to below average. Spontaneous delayed recall (i.e., retrieval) of previously learned information was exceptionally low. Retention rates were 0% across a story learning task, 0% across a list learning task,  and 0% across a figure recall task. Performance across recognition tasks was well below average to below average, suggesting limited evidence for information consolidation.   Results of emotional screening instruments suggested that recent symptoms of generalized anxiety were  in the minimal range, while symptoms of depression were within normal limits. A screening instrument assessing recent sleep quality suggested the presence of minimal sleep dysfunction.  Tables of Scores:   Note: This summary of test scores accompanies the interpretive report and should not be considered in isolation without reference to the appropriate sections in the text. Descriptors are based on appropriate normative data and may be adjusted based on clinical judgment. The terms "impaired" and "within normal limits (WNL)" are used when a more specific level of functioning cannot be determined.       Validity Testing:   DESCRIPTOR       Dot Counting Test: --- --- Within Expectation  RBANS Effort Index: --- --- Within Expectation  WAIS-IV Reliable Digit Span: --- --- Within Expectation       Orientation:      Raw Score Percentile   NAB Orientation, Form 1 23/29 --- ---       Cognitive Screening:           Raw Score Percentile   SLUMS: 11/30 --- ---       RBANS, Form A: Standard Score/ Scaled Score Percentile   Total Score 73 4 Well Below Average  Immediate Memory 73 4 Well Below Average    List Learning 4 2 Well Below Average    Story Memory 6 9 Below Average  Visuospatial/Constructional 81 10 Below Average    Figure Copy 7 16 Below Average    Line Orientation 14/20 17-25 Below Average to Average  Language 88 21 Below Average    Picture Naming 10/10 51-75 Average    Semantic Fluency 5 5 Well Below Average  Attention 94 34 Average    Digit Span 15 95 Well Above Average    Coding 3 1 Exceptionally Low  Delayed Memory 56 <1 Exceptionally Low    List Recall 0/10 <2 Exceptionally Low    List Recognition 17/20  10-16 Below Average    Story Recall 1 <1 Exceptionally Low    Story Recognition 7/12 5-7 Well Below Average    Figure Recall 1 <1 Exceptionally Low    Figure Recognition 2/8 2-3 Well Below Average       Intellectual Functioning:           Standard Score Percentile   Test of Premorbid Functioning: 104 61 Average       Attention/Executive Function:          Trail Making Test (TMT): Raw Score (T Score) Percentile     Part A 55 secs.,  0 errors (33) 5 Well Below Average    Part B 130 secs.,  0 errors (34) 5 Well Below Average         Scaled Score Percentile   WAIS-IV Digit Span: 10 50 Average    Forward 10 50 Average    Backward 9 37 Average    Sequencing 11 63 Average        Scaled Score Percentile   WAIS-IV Similarities: 8 25 Average       D-KEFS Color-Word Interference Test: Raw Score (Scaled Score) Percentile     Color Naming 49 secs. (4) 2 Well Below Average    Word Reading 34 secs. (6) 9 Below Average    Inhibition 137 secs. (1) <1 Exceptionally Low      Total Errors 6 errors (7) 16 Below Average    Inhibition/Switching 129 secs. (3) 1 Exceptionally Low      Total Errors 8 errors (5) 5 Well  Below Average       D-KEFS Verbal Fluency Test: Raw Score (Scaled Score) Percentile     Letter Total Correct 31 (9) 37 Average    Category Total Correct 22 (6) 9 Below Average    Category Switching Total Correct 7 (4) 2 Well Below Average    Category Switching Accuracy 6 (5) 5 Well Below Average      Total Set Loss Errors 0 (13) 84 Above Average      Total Repetition Errors 8 (5) 5 Well Below Average       D-KEFS 20 Questions Test: Scaled Score Percentile     Total Weighted Achievement Score 11 63 Average    Initial Abstraction Score 11 63 Average       NAB Executive Functions Module, Form 1: T Score Percentile     Judgment 65 93 Well Above Average       Language:          Verbal Fluency Test: Raw Score (T Score) Percentile     Phonemic Fluency (FAS) 31 (37) 9 Below  Average    Animal Fluency 10 (22) <1 Exceptionally Low        NAB Language Module, Form 1: T Score Percentile     Auditory Comprehension 56 73 Average    Naming 29/31 (44) 27 Average       Visuospatial/Visuoconstruction:      Raw Score Percentile   Clock Drawing: 8/10 --- Within Normal Limits        Scaled Score Percentile   WAIS-IV Block Design: 9 37 Average       Mood and Personality:      Raw Score Percentile   Geriatric Depression Scale: 4 --- Within Normal Limits  Geriatric Anxiety Scale: 1 --- Minimal    Somatic 1 --- Minimal    Cognitive 0 --- Minimal    Affective 0 --- Minimal       Additional Questionnaires:      Raw Score Percentile   PROMIS Sleep Disturbance Questionnaire: 22 --- None to Slight   Informed Consent and Coding/Compliance:   The current evaluation represents a clinical evaluation for the purposes previously outlined by the referral source and is in no way reflective of a forensic evaluation.   Ms. Phoebus was provided with a verbal description of the nature and purpose of the present neuropsychological evaluation. Also reviewed were the foreseeable risks and/or discomforts and benefits of the procedure, limits of confidentiality, and mandatory reporting requirements of this provider. The patient was given the opportunity to ask questions and receive answers about the evaluation. Oral consent to participate was provided by the patient.   This evaluation was conducted by Christia Reading, Ph.D., licensed clinical neuropsychologist. Ms. Boeck completed a clinical interview with Dr. Melvyn Novas, billed as one unit [phone], and 125 minutes of cognitive testing and scoring, billed as one unit [phone] and three additional units [id number]. Psychometrist Milana Kidney, B.S., assisted Dr. Melvyn Novas with test administration and scoring procedures. As a separate and discrete service, Dr. Melvyn Novas spent a total of 155 minutes in interpretation and report writing billed as one unit [phone] and two  units [id number].

## 2020-07-10 NOTE — Progress Notes (Signed)
   Psychometrician Note   Cognitive testing was administered to [employer] by Jordan Romero, B.S. (psychometrist) under the supervision of Dr. Christia Reading, Ph.D., licensed psychologist on 07/10/20. Jordan Romero did not appear overtly distressed by the testing session per behavioral observation or responses across self-report questionnaires. Rest breaks were offered.    The battery of tests administered was selected by Dr. Christia Reading, Ph.D. with consideration to Jordan Romero's current level of functioning, the nature of her symptoms, emotional and behavioral responses during interview, level of literacy, observed level of motivation/effort, and the nature of the referral question. This battery was communicated to the psychometrist. Communication between Dr. Christia Reading, Ph.D. and the psychometrist was ongoing throughout the evaluation and Dr. Christia Reading, Ph.D. was immediately accessible at all times. Dr. Christia Reading, Ph.D. provided supervision to the psychometrist on the date of this service to the extent necessary to assure the quality of all services provided.    Jordan Romero will return within approximately 1-2 weeks for an interactive feedback session with Dr. Melvyn Novas at which time her test performances, clinical impressions, and treatment recommendations will be reviewed in detail. Jordan Romero understands she can contact our office should she require our assistance before this time.  A total of 125 minutes of billable time were spent face-to-face with Jordan Romero by the psychometrist. This includes both test administration and scoring time. Billing for these services is reflected in the clinical report generated by Dr. Christia Reading, Ph.D.  This note reflects time spent with the psychometrician and does not include test scores or any clinical interpretations made by Dr. Melvyn Novas. The full report will follow in a separate note.

## 2020-07-12 DIAGNOSIS — K5900 Constipation, unspecified: Secondary | ICD-10-CM | POA: Diagnosis not present

## 2020-07-17 ENCOUNTER — Ambulatory Visit (INDEPENDENT_AMBULATORY_CARE_PROVIDER_SITE_OTHER): Payer: Medicare HMO | Admitting: Psychology

## 2020-07-17 ENCOUNTER — Other Ambulatory Visit: Payer: Self-pay

## 2020-07-17 DIAGNOSIS — G3184 Mild cognitive impairment, so stated: Secondary | ICD-10-CM

## 2020-07-17 NOTE — Progress Notes (Signed)
   Neuropsychology Feedback Session [name]. [facility]  Reason for Referral:   SHAMIKIA LINSKEY a 70 y.o. right-handed female referred by Ellouise Newer, M.D.,to characterize hercurrent cognitive functioning and assist with diagnostic clarity and treatment planning in the context of subjective cognitive decline.   Feedback:   Ms. Oshea completed a comprehensive neuropsychological evaluation on 07/10/2020. Please refer to that encounter for the full report and recommendations. Briefly, results suggested a primary impairment surrounding learning and memory, especially retrieval/consolidation aspects of memory. Performance variability was further exhibited across processing speed, executive functioning (with reasoning and safety/judgment abilities intact), and semantic fluency; however, the former may be impacted by visual acuity concerns. Specific to memory, Ms. Foister was fully amnestic across all three memory measures. Furthermore, while performances on yes/no recognitions items were not fully suggestive of rapid forgetting, they were below expectation and concerning for an evolving memory storage deficit. Combined, this pattern of memory dysfunction is concerning for early stages of Alzheimer's disease.   Ms. Kruser was accompanied by her husband and daughter during the current feedback session. Content of the current session focused on the results of her neuropsychological evaluation. Ms. Mizzell and her family were given the opportunity to ask questions and their questions were answered. They were encouraged to reach out should additional questions arise. A copy of her report was provided at the conclusion of the visit.      25 minutes were spent conducting the current feedback session with Ms. Ivins, billed as one unit [id number].

## 2020-07-19 ENCOUNTER — Ambulatory Visit: Payer: Medicare HMO | Admitting: Neurology

## 2020-07-19 ENCOUNTER — Other Ambulatory Visit: Payer: Self-pay

## 2020-07-19 ENCOUNTER — Encounter: Payer: Self-pay | Admitting: Neurology

## 2020-07-19 VITALS — BP 143/88 | HR 58 | Ht 65.0 in | Wt 215.4 lb

## 2020-07-19 DIAGNOSIS — G3184 Mild cognitive impairment, so stated: Secondary | ICD-10-CM | POA: Diagnosis not present

## 2020-07-19 MED ORDER — DONEPEZIL HCL 10 MG PO TABS: ORAL_TABLET | ORAL | 3 refills | Status: DC

## 2020-07-19 NOTE — Progress Notes (Signed)
NEUROLOGY FOLLOW UP OFFICE NOTE  JENEA DAKE [id number] 17-Nov-1950  HISTORY OF PRESENT ILLNESS: I had the pleasure of seeing Jeffifer Rabold in follow-up in the neurology clinic on 07/19/2020.  The patient was last seen 2 months ago. Her husband was concerned about memory loss. She is accompanied by her husband who helps supplement the history today. Records and images were personally reviewed where available.  I personally reviewed MRI brain without contrast done 05/2020 which did not show any acute changes, there was mild diffuse volume loss, minimal chronic microvascular disease. She underwent Neuropsychological testing last week which was suggestive of primary impairment surrounding learning and memory, especially retrieval/consolidation aspects of memory. Since she and her husband denied difficulties with ADLs, she was diagnosed with Mild Neurocognitive Disorder. Etiology unclear, however pattern was concerning for early stages of Alzheimer's disease.   We discussed diagnosis, prognosis, management. She reports that she has read about brisk walking and reads a lot. Her husband notes she would read but forget about what she read. She provides additional information that aside from her mother, her maternal grandmother also had Alzheimer's disease.   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 Her husband provided additional history prior to the visit and states that the patient thinks this visit is for her abdominal pain, however he had expressed concerns about memory to Dr. Orland Mustard. She states she thought the reason she was referred was for the tightness in her lower abdomen. We discussed her abdominal pain, she reports a tightness in her lower abdominal region that has been ongoing for at least a year. She  states it is there all the time, but is not present today. She may take an Ibuprofen which would help. She was tried on Valium in the past which did help, "but no one would prescribe it for me." There is no associated nausea/vomiting, vaginal symptoms, bowel/bladder dysfunction, back pain, focal numbness/tingling/weakness in her extremities.   She states that her memory has never been that good, and keeps repeating "I think it's more of an attention issue." She states her memory is not terrible but she is somewhat forgetful. 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 She lives with her husband. Her hsuband started noticing changes 5-6 years ago. He states they would have the same conversation multiple times, asking the same question repeatedly. He states she either has a "total lack of memory or total attention deficit disorder, can't pay attention." She manages her own medications and denies missing any doses. Her husband reported she may get disoriented when she is out of routine when they travel. She stopped driving a year ago due to her vision, she has less vision on her left eye, difficulty with peripheral vision on both eyes. Her husband notes she would get lost driving, she would make a wrong turn from her daughter's house and come home an hour later. There were several dents on the car. Her husband manages finances. She forgets to turn off the stove or oven, she admits to doing this yesterday but states this is not frequent. She misplaces things, her husband reports she would unload the dishwasher and leave all the cabinets open. She denies any word-finding difficulties. Her husband reports there were a couple of nights that she turns on the lights not  knowing where she was, she said she was downstairs when they were in the bedroom. No paranoia or hallucinations. No significant personality changes, her husband notes when she misses Cymbalta she is  more irritable and argumentative. She states her mood is fine. When their daughter got married in 2020, she was not interacting with the wedding party. She was found to have earwax and got hearing aids, but only wants to wear them when she watches TV at night. Sleep is good, no daytime drowsiness. Her mother had Alzheimer's disease in her 42s. She drinks a glass of wine every night. No significant head injuries.   She denies any headaches, dizziness, diplopia, dysarthria, dysphagia, neck/back pain, focal numbness/tingling/weakness, bowel/bladder dysfunction. No anosmia, tremors, no falls. She endorses attention issues when younger, but none in college. She recalls her husband making her do a cognitive evaluation in [place] many years ago while she was still working full time, she does not recall results but they were "mixed results" and again states "I do have attention issues, I know that." She takes a daily B12 supplement for low B12 level.    PAST MEDICAL HISTORY: Past Medical History:  Diagnosis Date  . Acquired hypothyroidism 04/28/2013  . Cholelithiasis   . Congenital anomaly of eye   . 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 Hiatal hernia 02/14/2016   Noted on imaging by chiropractor  . History of colonic polyps   . Hyperlipidemia   . Hypertension   . Insomnia   . Knee pain, bilateral 07/04/2020  . 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 MCI (mild cognitive impairment) 07/10/2020  . Pain in pelvis   . 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 Pseudophakia of both eyes 12/19/2017  . Recurrent urinary tract infection   . 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    MEDICATIONS: Current Outpatient Medications on File Prior to Visit  Medication Sig Dispense Refill  . azaTHIOprine (IMURAN) 50 MG tablet     . cycloSPORINE modified (NEORAL) 50 MG capsule cyclosporine modified 50 mg  capsule    . dorzolamide-timolol (COSOPT) 22.3-6.8 MG/ML ophthalmic solution dorzolamide 22.3 mg-timolol 6.8 mg/mL eye drops    . DULoxetine (CYMBALTA) 20 MG capsule Take by mouth.    . DULoxetine (CYMBALTA) 60 MG capsule duloxetine 60 mg capsule,delayed release    . folic acid (FOLVITE) 1 MG tablet Take by mouth.    . levothyroxine (SYNTHROID) 75 MCG tablet      No current facility-administered medications on file prior to visit.    ALLERGIES: No Known Allergies  FAMILY HISTORY: Family History  Problem Relation Age of Onset  . Alzheimer's disease Mother        symptom onset in 38s    SOCIAL HISTORY: Social History   Socioeconomic History  . Marital status: Married    Spouse name: Not on file  . Number of children: Not on file  . Years of education: 23  . Highest education level: Master's degree (e.g., MA, MS, MEng, MEd, MSW, MBA)  Occupational History  . Occupation: Retired  Tobacco Use  . Smoking status: Former Smoker    Quit date: 2016    Years since quitting: 6.3  . Smokeless tobacco: Never Used  . Tobacco comment: Quit for 28 years, started in 2014 again and quit again in 2016.  Vaping Use  . Vaping Use: Never used  Substance and Sexual Activity  . Alcohol use: Yes  Alcohol/week: 7.0 standard drinks    Types: 7 Glasses of wine per week    Comment: One glass of wine a day  . Drug use: Never  . Sexual activity: Not on file  Other Topics Concern  . Not on file  Social History Narrative   Right handed   Lives in two story town home with husband.   Drinks Caffeine One cup daily   Social Determinants of Health   Financial Resource Strain: Not on file  Food Insecurity: Not on file  Transportation Needs: Not on file  Physical Activity: Not on file  Stress: Not on file  Social Connections: Not on file  Intimate Partner Violence: Not on file     PHYSICAL EXAM: Vitals:   07/19/20 0952  BP: (!) 143/88  Pulse: (!) 58  SpO2: 99%   General: No acute  distress Head:  Normocephalic/atraumatic Skin/Extremities: No rash, no edema Neurological Exam: alert and awake. No aphasia or dysarthria. Fund of knowledge is appropriate.  Attention and concentration are normal.   Cranial nerves: Pupils equal, round. Extraocular movements intact.  No facial asymmetry.  Motor: moves all extremities symmetrically. 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 We discussed results of brain MRI and Neuropsychological evaluation, discussed pattern concerning for early stages of Alzheimer's disease. We discussed starting Donepezil, including side effects and expectations. We also discussed the importance of control of vascular risk factors, physical exercise, and brain stimulation exercises for brain health. All of their questions were addressed today. She will follow-up with our Memory Disorders PA Sharene Butters in 6 months, they know to call for any changes.    Thank you for allowing me to participate in her care.  Please do not hesitate to call for any questions or concerns.   Ellouise Newer, M.D.   CC: Dr. Orland Mustard

## 2020-07-19 NOTE — Patient Instructions (Signed)
1. Start Donepezil (Aricept) 10mg : take 1/2 tablet daily for 2 weeks, then increase to 1 tablet daily  2. Follow-up in 6 months, call for any changes   FALL PRECAUTIONS: Be cautious when walking. Scan the area for obstacles that may increase the risk of trips and falls. When getting up in the mornings, sit up at the edge of the bed for a few minutes before getting out of bed. Consider elevating the bed at the head end to avoid drop of blood pressure when getting up. Walk always in a well-lit room (use night lights in the walls). Avoid area rugs or power cords from appliances in the middle of the walkways. Use a walker or a cane if necessary and consider physical therapy for balance exercise. Get your eyesight checked regularly.  FINANCIAL OVERSIGHT: Supervision, especially oversight when making financial decisions or transactions is also recommended.  HOME SAFETY: Consider the safety of the kitchen when operating appliances like stoves, microwave oven, and blender. Consider having supervision and share cooking responsibilities until no longer able to participate in those. Accidents with firearms and other hazards in the house should be identified and addressed as well.  DRIVING: Regarding driving, in patients with progressive memory problems, driving will be impaired. We advise to have someone else do the driving if trouble finding directions or if minor accidents are reported. Independent driving assessment is available to determine safety of driving.  ABILITY TO BE LEFT ALONE: If patient is unable to contact 911 operator, consider using LifeLine, or when the need is there, arrange for someone to stay with patients. Smoking is a fire hazard, consider supervision or cessation. Risk of wandering should be assessed by caregiver and if detected at any point, supervision and safe proof recommendations should be instituted.  MEDICATION SUPERVISION: Inability to self-administer medication needs to be  constantly addressed. Implement a mechanism to ensure safe administration of the medications.  RECOMMENDATIONS FOR ALL PATIENTS WITH MEMORY PROBLEMS: 1. Continue to exercise (Recommend 30 minutes of walking everyday, or 3 hours every week) 2. Increase social interactions - continue going to [place] and enjoy social gatherings with friends and family 3. Eat healthy, avoid fried foods and eat more fruits and vegetables 4. Maintain adequate blood pressure, blood sugar, and blood cholesterol level. Reducing the risk of stroke and cardiovascular disease also helps promoting better memory. 5. Avoid stressful situations. Live a simple life and avoid aggravations. Organize your time and prepare for the next day in anticipation. 6. Sleep well, avoid any interruptions of sleep and avoid any distractions in the bedroom that may interfere with adequate sleep quality 7. Avoid sugar, avoid sweets as there is a strong link between excessive sugar intake, diabetes, and cognitive impairment We discussed the Mediterranean diet, which has been shown to help patients reduce the risk of progressive memory disorders and reduces cardiovascular risk. This includes eating fish, eat fruits and green leafy vegetables, nuts like almonds and hazelnuts, walnuts, and also use olive oil. Avoid fast foods and fried foods as much as possible. Avoid sweets and sugar as sugar use has been linked to worsening of memory function.  There is always a concern of gradual progression of memory problems. If this is the case, then we may need to adjust level of care according to patient needs. Support, both to the patient and caregiver, should then be put into place.       Mediterranean Diet  Why follow it? Research shows. . Those who follow the Mediterranean diet have  a reduced risk of heart disease  . The diet is associated with a reduced incidence of Parkinson's and Alzheimer's diseases . People following the diet may have longer life  expectancies and lower rates of chronic diseases  . The Dietary Guidelines for Americans recommends the Mediterranean diet as an eating plan to promote health and prevent disease  What Is the Mediterranean Diet?  . Healthy eating plan based on typical foods and recipes of Mediterranean-style cooking . The diet is primarily a plant based diet; these foods should make up a majority of meals   Starches - Plant based foods should make up a majority of meals - They are an important sources of vitamins, minerals, energy, antioxidants, and fiber - Choose whole grains, foods high in fiber and minimally processed items  - Typical grain sources include wheat, oats, barley, corn, brown rice, bulgar, farro, millet, polenta, couscous  - Various types of beans include chickpeas, lentils, fava beans, black beans, white beans   Fruits  Veggies - Large quantities of antioxidant rich fruits & veggies; 6 or more servings  - Vegetables can be eaten raw or lightly drizzled with oil and cooked  - Vegetables common to the traditional Mediterranean Diet include: artichokes, arugula, beets, broccoli, brussel sprouts, cabbage, carrots, celery, collard greens, cucumbers, eggplant, kale, leeks, lemons, lettuce, mushrooms, okra, onions, peas, peppers, potatoes, pumpkin, radishes, rutabaga, shallots, spinach, sweet potatoes, turnips, zucchini - Fruits common to the Mediterranean Diet include: apples, apricots, avocados, cherries, clementines, dates, figs, grapefruits, grapes, melons, nectarines, oranges, peaches, pears, pomegranates, strawberries, tangerines  Fats - Replace butter and margarine with healthy oils, such as olive oil, canola oil, and tahini  - Limit nuts to no more than a handful a day  - Nuts include walnuts, almonds, pecans, pistachios, pine nuts  - Limit or avoid candied, honey roasted or heavily salted nuts - Olives are central to the [employer] - can be eaten whole or used in a variety of dishes    Meats Protein - Limiting red meat: no more than a few times a month - When eating red meat: choose lean cuts and keep the portion to the size of deck of cards - Eggs: approx. 0 to 4 times a week  - Fish and lean poultry: at least 2 a week  - Healthy protein sources include, chicken, [place], lean beef, lamb - Increase intake of seafood such as tuna, salmon, trout, mackerel, shrimp, scallops - Avoid or limit high fat processed meats such as sausage and bacon  Dairy - Include moderate amounts of low fat dairy products  - Focus on healthy dairy such as fat free yogurt, skim milk, low or reduced fat cheese - Limit dairy products higher in fat such as whole or 2% milk, cheese, ice cream  Alcohol - Moderate amounts of red wine is ok  - No more than 5 oz daily for women (all ages) and men older than age 52  - No more than 10 oz of wine daily for men younger than 71  Other - Limit sweets and other desserts  - Use herbs and spices instead of salt to flavor foods  - Herbs and spices common to the traditional Mediterranean Diet include: basil, bay leaves, chives, cloves, cumin, fennel, garlic, lavender, marjoram, mint, oregano, parsley, pepper, rosemary, sage, savory, sumac, tarragon, thyme   It's not just a diet, it's a lifestyle:  . The Mediterranean diet includes lifestyle factors typical of those in the region  . Foods, drinks  and meals are best eaten with others and savored . Daily physical activity is important for overall good health . This could be strenuous exercise like running and aerobics . This could also be more leisurely activities such as walking, housework, yard-work, or taking the stairs . Moderation is the key; a balanced and healthy diet accommodates most foods and drinks . Consider portion sizes and frequency of consumption of certain foods   Meal Ideas & Options:  . Breakfast:  o Whole wheat toast or whole wheat English muffins with peanut butter & hard boiled egg o Steel cut  oats topped with apples & cinnamon and skim milk  o Fresh fruit: banana, strawberries, melon, berries, peaches  o Smoothies: strawberries, bananas, greek yogurt, peanut butter o Low fat greek yogurt with blueberries and granola  o Egg white omelet with spinach and mushrooms o Breakfast couscous: whole wheat couscous, apricots, skim milk, cranberries  . Sandwiches:  o Hummus and grilled vegetables (peppers, zucchini, squash) on whole wheat bread   o Grilled chicken on whole wheat pita with lettuce, tomatoes, cucumbers or tzatziki  o Tuna salad on whole wheat bread: tuna salad made with greek yogurt, olives, red peppers, capers, green onions o Garlic rosemary lamb pita: lamb sauted with garlic, rosemary, salt & pepper; add lettuce, cucumber, greek yogurt to pita - flavor with lemon juice and black pepper  . Seafood:  o Mediterranean grilled salmon, seasoned with garlic, basil, parsley, lemon juice and black pepper o Shrimp, lemon, and spinach whole-grain pasta salad made with low fat greek yogurt  o Seared scallops with lemon orzo  o Seared tuna steaks seasoned salt, pepper, coriander topped with tomato mixture of olives, tomatoes, olive oil, minced garlic, parsley, green onions and cappers  . Meats:  o Herbed greek chicken salad with kalamata olives, cucumber, feta  o Red bell peppers stuffed with spinach, bulgur, lean ground beef (or lentils) & topped with feta   o Kebabs: skewers of chicken, tomatoes, onions, zucchini, squash  o [place] burgers: made with red onions, mint, dill, lemon juice, feta cheese topped with roasted red peppers . Vegetarian o Cucumber salad: cucumbers, artichoke hearts, celery, red onion, feta cheese, tossed in olive oil & lemon juice  o Hummus and whole grain pita points with a greek salad (lettuce, tomato, feta, olives, cucumbers, red onion) o Lentil soup with celery, carrots made with vegetable broth, garlic, salt and pepper  o Tabouli salad: parsley, bulgur,  mint, scallions, cucumbers, tomato, radishes, lemon juice, olive oil, salt and pepper.

## 2020-08-01 DIAGNOSIS — K5900 Constipation, unspecified: Secondary | ICD-10-CM | POA: Diagnosis not present

## 2020-08-01 DIAGNOSIS — I10 Essential (primary) hypertension: Secondary | ICD-10-CM | POA: Diagnosis not present

## 2020-08-08 DIAGNOSIS — H3581 Retinal edema: Secondary | ICD-10-CM | POA: Diagnosis not present

## 2020-08-08 DIAGNOSIS — H30103 Unspecified disseminated chorioretinal inflammation, bilateral: Secondary | ICD-10-CM | POA: Diagnosis not present

## 2020-08-08 DIAGNOSIS — Z79899 Other long term (current) drug therapy: Secondary | ICD-10-CM | POA: Diagnosis not present

## 2020-08-08 DIAGNOSIS — H30033 Focal chorioretinal inflammation, peripheral, bilateral: Secondary | ICD-10-CM | POA: Diagnosis not present

## 2020-08-08 DIAGNOSIS — H44113 Panuveitis, bilateral: Secondary | ICD-10-CM | POA: Diagnosis not present

## 2020-08-08 DIAGNOSIS — Z961 Presence of intraocular lens: Secondary | ICD-10-CM | POA: Diagnosis not present

## 2020-08-15 DIAGNOSIS — H3552 Pigmentary retinal dystrophy: Secondary | ICD-10-CM | POA: Diagnosis not present

## 2020-10-24 DIAGNOSIS — Z961 Presence of intraocular lens: Secondary | ICD-10-CM | POA: Diagnosis not present

## 2020-10-24 DIAGNOSIS — Z79899 Other long term (current) drug therapy: Secondary | ICD-10-CM | POA: Diagnosis not present

## 2020-10-24 DIAGNOSIS — E538 Deficiency of other specified B group vitamins: Secondary | ICD-10-CM | POA: Diagnosis not present

## 2020-10-24 DIAGNOSIS — H30033 Focal chorioretinal inflammation, peripheral, bilateral: Secondary | ICD-10-CM | POA: Diagnosis not present

## 2020-10-24 DIAGNOSIS — R413 Other amnesia: Secondary | ICD-10-CM | POA: Diagnosis not present

## 2020-10-24 DIAGNOSIS — H44113 Panuveitis, bilateral: Secondary | ICD-10-CM | POA: Diagnosis not present

## 2020-10-24 DIAGNOSIS — H3581 Retinal edema: Secondary | ICD-10-CM | POA: Diagnosis not present

## 2020-10-24 DIAGNOSIS — Z0000 Encounter for general adult medical examination without abnormal findings: Secondary | ICD-10-CM | POA: Diagnosis not present

## 2020-10-24 DIAGNOSIS — I10 Essential (primary) hypertension: Secondary | ICD-10-CM | POA: Diagnosis not present

## 2020-10-24 DIAGNOSIS — H30103 Unspecified disseminated chorioretinal inflammation, bilateral: Secondary | ICD-10-CM | POA: Diagnosis not present

## 2020-10-24 DIAGNOSIS — E785 Hyperlipidemia, unspecified: Secondary | ICD-10-CM | POA: Diagnosis not present

## 2020-10-24 DIAGNOSIS — E039 Hypothyroidism, unspecified: Secondary | ICD-10-CM | POA: Diagnosis not present

## 2020-10-30 DIAGNOSIS — K5900 Constipation, unspecified: Secondary | ICD-10-CM | POA: Diagnosis not present

## 2020-10-30 DIAGNOSIS — Z86010 Personal history of colonic polyps: Secondary | ICD-10-CM | POA: Diagnosis not present

## 2020-11-02 DIAGNOSIS — L821 Other seborrheic keratosis: Secondary | ICD-10-CM | POA: Diagnosis not present

## 2020-11-02 DIAGNOSIS — L814 Other melanin hyperpigmentation: Secondary | ICD-10-CM | POA: Diagnosis not present

## 2020-12-19 ENCOUNTER — Other Ambulatory Visit: Payer: Self-pay | Admitting: Family Medicine

## 2020-12-19 DIAGNOSIS — Z1231 Encounter for screening mammogram for malignant neoplasm of breast: Secondary | ICD-10-CM

## 2021-01-16 DIAGNOSIS — H3581 Retinal edema: Secondary | ICD-10-CM | POA: Diagnosis not present

## 2021-01-16 DIAGNOSIS — H30103 Unspecified disseminated chorioretinal inflammation, bilateral: Secondary | ICD-10-CM | POA: Diagnosis not present

## 2021-01-16 DIAGNOSIS — H30033 Focal chorioretinal inflammation, peripheral, bilateral: Secondary | ICD-10-CM | POA: Diagnosis not present

## 2021-01-16 DIAGNOSIS — H44113 Panuveitis, bilateral: Secondary | ICD-10-CM | POA: Diagnosis not present

## 2021-01-16 DIAGNOSIS — Z961 Presence of intraocular lens: Secondary | ICD-10-CM | POA: Diagnosis not present

## 2021-01-16 DIAGNOSIS — Z79899 Other long term (current) drug therapy: Secondary | ICD-10-CM | POA: Diagnosis not present

## 2021-01-18 ENCOUNTER — Other Ambulatory Visit: Payer: Self-pay

## 2021-01-18 ENCOUNTER — Ambulatory Visit
Admission: RE | Admit: 2021-01-18 | Discharge: 2021-01-18 | Disposition: A | Discharge status: Home / Self Care | Payer: Medicare HMO | Source: Ambulatory Visit | Attending: Family Medicine | Admitting: Family Medicine

## 2021-01-18 DIAGNOSIS — Z1231 Encounter for screening mammogram for malignant neoplasm of breast: Secondary | ICD-10-CM

## 2021-01-18 DIAGNOSIS — M1712 Unilateral primary osteoarthritis, left knee: Secondary | ICD-10-CM

## 2021-01-18 HISTORY — DX: Unilateral primary osteoarthritis, left knee: M17.12

## 2021-01-19 ENCOUNTER — Encounter: Payer: Self-pay | Admitting: Physician Assistant

## 2021-01-19 ENCOUNTER — Ambulatory Visit: Payer: Medicare HMO | Admitting: Physician Assistant

## 2021-01-19 ENCOUNTER — Other Ambulatory Visit: Payer: Self-pay | Admitting: Family Medicine

## 2021-01-19 VITALS — BP 140/86 | HR 61 | Resp 18 | Ht 65.0 in | Wt 218.0 lb

## 2021-01-19 DIAGNOSIS — G3184 Mild cognitive impairment, so stated: Secondary | ICD-10-CM

## 2021-01-19 DIAGNOSIS — R928 Other abnormal and inconclusive findings on diagnostic imaging of breast: Secondary | ICD-10-CM

## 2021-01-19 DIAGNOSIS — M1712 Unilateral primary osteoarthritis, left knee: Secondary | ICD-10-CM | POA: Diagnosis not present

## 2021-01-19 MED ORDER — DONEPEZIL HCL 10 MG PO TABS: ORAL_TABLET | ORAL | 3 refills | Status: DC

## 2021-01-19 NOTE — Patient Instructions (Addendum)
Continue donepezil 10 mg daily.  CBT list provided  Follow-up in 6 months, call for any changes   FALL PRECAUTIONS: Be cautious when walking. Scan the area for obstacles that may increase the risk of trips and falls. When getting up in the mornings, sit up at the edge of the bed for a few minutes before getting out of bed. Consider elevating the bed at the head end to avoid drop of blood pressure when getting up. Walk always in a well-lit room (use night lights in the walls). Avoid area rugs or power cords from appliances in the middle of the walkways. Use a walker or a cane if necessary and consider physical therapy for balance exercise. Get your eyesight checked regularly.  FINANCIAL OVERSIGHT: Supervision, especially oversight when making financial decisions or transactions is also recommended.  HOME SAFETY: Consider the safety of the kitchen when operating appliances like stoves, microwave oven, and blender. Consider having supervision and share cooking responsibilities until no longer able to participate in those. Accidents with firearms and other hazards in the house should be identified and addressed as well.  DRIVING: Regarding driving, in patients with progressive memory problems, driving will be impaired. We advise to have someone else do the driving if trouble finding directions or if minor accidents are reported. Independent driving assessment is available to determine safety of driving.  ABILITY TO BE LEFT ALONE: If patient is unable to contact 911 operator, consider using LifeLine, or when the need is there, arrange for someone to stay with patients. Smoking is a fire hazard, consider supervision or cessation. Risk of wandering should be assessed by caregiver and if detected at any point, supervision and safe proof recommendations should be instituted.  MEDICATION SUPERVISION: Inability to self-administer medication needs to be constantly addressed. Implement a mechanism to ensure safe  administration of the medications.  RECOMMENDATIONS FOR ALL PATIENTS WITH MEMORY PROBLEMS: 1. Continue to exercise (Recommend 30 minutes of walking everyday, or 3 hours every week) 2. Increase social interactions - continue going to [place] and enjoy social gatherings with friends and family 3. Eat healthy, avoid fried foods and eat more fruits and vegetables 4. Maintain adequate blood pressure, blood sugar, and blood cholesterol level. Reducing the risk of stroke and cardiovascular disease also helps promoting better memory. 5. Avoid stressful situations. Live a simple life and avoid aggravations. Organize your time and prepare for the next day in anticipation. 6. Sleep well, avoid any interruptions of sleep and avoid any distractions in the bedroom that may interfere with adequate sleep quality 7. Avoid sugar, avoid sweets as there is a strong link between excessive sugar intake, diabetes, and cognitive impairment We discussed the Mediterranean diet, which has been shown to help patients reduce the risk of progressive memory disorders and reduces cardiovascular risk. This includes eating fish, eat fruits and green leafy vegetables, nuts like almonds and hazelnuts, walnuts, and also use olive oil. Avoid fast foods and fried foods as much as possible. Avoid sweets and sugar as sugar use has been linked to worsening of memory function.  There is always a concern of gradual progression of memory problems. If this is the case, then we may need to adjust level of care according to patient needs. Support, both to the patient and caregiver, should then be put into place.       Mediterranean Diet  Why follow it? Research shows. Those who follow the Mediterranean diet have a reduced risk of heart disease  The diet is associated  with a reduced incidence of Parkinson's and Alzheimer's diseases People following the diet may have longer life expectancies and lower rates of chronic diseases  The Dietary  Guidelines for Americans recommends the Mediterranean diet as an eating plan to promote health and prevent disease  What Is the Mediterranean Diet?  Healthy eating plan based on typical foods and recipes of Mediterranean-style cooking The diet is primarily a plant based diet; these foods should make up a majority of meals   Starches - Plant based foods should make up a majority of meals - They are an important sources of vitamins, minerals, energy, antioxidants, and fiber - Choose whole grains, foods high in fiber and minimally processed items  - Typical grain sources include wheat, oats, barley, corn, brown rice, bulgar, farro, millet, polenta, couscous  - Various types of beans include chickpeas, lentils, fava beans, black beans, white beans   Fruits  Veggies - Large quantities of antioxidant rich fruits & veggies; 6 or more servings  - Vegetables can be eaten raw or lightly drizzled with oil and cooked  - Vegetables common to the traditional Mediterranean Diet include: artichokes, arugula, beets, broccoli, brussel sprouts, cabbage, carrots, celery, collard greens, cucumbers, eggplant, kale, leeks, lemons, lettuce, mushrooms, okra, onions, peas, peppers, potatoes, pumpkin, radishes, rutabaga, shallots, spinach, sweet potatoes, turnips, zucchini - Fruits common to the Mediterranean Diet include: apples, apricots, avocados, cherries, clementines, dates, figs, grapefruits, grapes, melons, nectarines, oranges, peaches, pears, pomegranates, strawberries, tangerines  Fats - Replace butter and margarine with healthy oils, such as olive oil, canola oil, and tahini  - Limit nuts to no more than a handful a day  - Nuts include walnuts, almonds, pecans, pistachios, pine nuts  - Limit or avoid candied, honey roasted or heavily salted nuts - Olives are central to the [employer] - can be eaten whole or used in a variety of dishes   Meats Protein - Limiting red meat: no more than a few times a  month - When eating red meat: choose lean cuts and keep the portion to the size of deck of cards - Eggs: approx. 0 to 4 times a week  - Fish and lean poultry: at least 2 a week  - Healthy protein sources include, chicken, [place], lean beef, lamb - Increase intake of seafood such as tuna, salmon, trout, mackerel, shrimp, scallops - Avoid or limit high fat processed meats such as sausage and bacon  Dairy - Include moderate amounts of low fat dairy products  - Focus on healthy dairy such as fat free yogurt, skim milk, low or reduced fat cheese - Limit dairy products higher in fat such as whole or 2% milk, cheese, ice cream  Alcohol - Moderate amounts of red wine is ok  - No more than 5 oz daily for women (all ages) and men older than age 53  - No more than 10 oz of wine daily for men younger than 30  Other - Limit sweets and other desserts  - Use herbs and spices instead of salt to flavor foods  - Herbs and spices common to the traditional Mediterranean Diet include: basil, bay leaves, chives, cloves, cumin, fennel, garlic, lavender, marjoram, mint, oregano, parsley, pepper, rosemary, sage, savory, sumac, tarragon, thyme   It's not just a diet, it's a lifestyle:  The Mediterranean diet includes lifestyle factors typical of those in the region  Foods, drinks and meals are best eaten with others and savored Daily physical activity is important for overall good health  This could be strenuous exercise like running and aerobics This could also be more leisurely activities such as walking, housework, yard-work, or taking the stairs Moderation is the key; a balanced and healthy diet accommodates most foods and drinks Consider portion sizes and frequency of consumption of certain foods   Meal Ideas & Options:  Breakfast:  Whole wheat toast or whole wheat English muffins with peanut butter & hard boiled egg Steel cut oats topped with apples & cinnamon and skim milk  Fresh fruit: banana,  strawberries, melon, berries, peaches  Smoothies: strawberries, bananas, greek yogurt, peanut butter Low fat greek yogurt with blueberries and granola  Egg white omelet with spinach and mushrooms Breakfast couscous: whole wheat couscous, apricots, skim milk, cranberries  Sandwiches:  Hummus and grilled vegetables (peppers, zucchini, squash) on whole wheat bread   Grilled chicken on whole wheat pita with lettuce, tomatoes, cucumbers or tzatziki  [place] salad on whole wheat bread: tuna salad made with greek yogurt, olives, red peppers, capers, green onions Garlic rosemary lamb pita: lamb sauted with garlic, rosemary, salt & pepper; add lettuce, cucumber, greek yogurt to pita - flavor with lemon juice and black pepper  Seafood:  Mediterranean grilled salmon, seasoned with garlic, basil, parsley, lemon juice and black pepper Shrimp, lemon, and spinach whole-grain pasta salad made with low fat greek yogurt  Seared scallops with lemon orzo  Seared tuna steaks seasoned salt, pepper, coriander topped with tomato mixture of olives, tomatoes, olive oil, minced garlic, parsley, green onions and cappers  Meats:  Herbed greek chicken salad with kalamata olives, cucumber, feta  Red bell peppers stuffed with spinach, bulgur, lean ground beef (or lentils) & topped with feta   Kebabs: skewers of chicken, tomatoes, onions, zucchini, squash  [place] burgers: made with red onions, mint, dill, lemon juice, feta cheese topped with roasted red peppers Vegetarian Cucumber salad: cucumbers, artichoke hearts, celery, red onion, feta cheese, tossed in olive oil & lemon juice  Hummus and whole grain pita points with a greek salad (lettuce, tomato, feta, olives, cucumbers, red onion) Lentil soup with celery, carrots made with vegetable broth, garlic, salt and pepper  Tabouli salad: parsley, bulgur, mint, scallions, cucumbers, tomato, radishes, lemon juice, olive oil, salt and pepper.

## 2021-01-19 NOTE — Progress Notes (Signed)
Assessment/Plan:    Mild cognitive impairment   Recommendations:  Discussed safety both in and out of the home.  Discussed the importance of regular daily schedule with inclusion of crossword puzzles to maintain brain function.  Continue to monitor mood by PCP for now Stay active at least 30 minutes at least 3 times a week.  Naps should be scheduled and should be no longer than 60 minutes and should not occur after 2 PM.  Mediterranean diet is recommended  Continue donepezil 10 mg daily Side effects were discussed Repeat neuropsychological evaluation in 18 to 24 months or sooner if functional decline noted to says the trajectory of future cognitive decline if should occur A list of cognitive behavioral therapies have been given to her, as she is not interested in psychiatric evaluation. Follow up in  6 months.   Case discussed with Dr. Delice Lesch who agrees with the plan  70 year old right-handed woman Her husband has been concerned about her memory and requested Neurology referral with patient thinking she is here for abdominal tightness. Exam does not show any neurological cause for her abdominal symptoms, findings discussed with patient. Discussed concerns with MOCA of 16/30. Her husband separately disclosed memory issues raising concern for dementia. She is agreeable to doing an MRI brain without contrast to assess for underlying structural abnormality. She feels her memory issues are due to attention deficit, she will be scheduled for Neuropsychological evaluation to further evaluate cognitive concerns. Follow-up after testing, she knows to call for any changes.    Subjective:   ED visits since last seen: none  Hospital admissions: none  Jordan Romero is a 70 y.o. 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  This patient is accompanied in the office by her husband who  supplements the history.  Previous records as well as any outside records available were reviewed prior to todays visit.  She was last seen at our office on 07/19/2020.  Last MoCA on 05/04/2020 was 16/30. Patient is currently on donepezil 10 mg daily, tolerating well. When asked about her memory, she reports "I do not feel I have a memory problem".  Her husband disagrees, he feels that the memory may be slightly worse, especially when out of the house or taking care of her daily routine, which creates significant anxiety.  If she cannot find her keys for example "she can panic "-her husband says.  She continues to repeat stories and asked the same questions.  She cannot drive because of her eyesight deficiencies, followed by ophthalmology.  Although she denies it, her husband says that if he is missing for 10 minutes, she will begin to panic and causing on the phone asking him where he is.  "Sometimes she is paranoid, blaming him that he is not coming back.  Socially, she does not interact with people.  They went to a wedding recently, and she sat at the table without talking to anyone.  She sleeps well, denies vivid dreams, sleepwalking, hallucinations.  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  She does not wish to be followed by psychiatry.  About 6 weeks ago, he had to tell her to shower, because she had not done so in 1 week.  No concerns on dressing, she does not need assistance for either.  She takes her own medication, lining up the bottles, she  does not like the pillbox.  Her husband manages the finances.  Her appetite is good, denies trouble swallowing.  She cooks and occasionally she leaves the stove on. Ambulates without difficulty.  No falls or head injuries are reported.  Her husband does the driving.  Denies headaches, double vision, dizziness, focal numbness or tingling, unilateral weakness or tremors or anosmia. No history of seizures. Denies urine  incontinence, retention, constipation or diarrhea.  "The tightening of the stomach that she mentioned before, was caused by amlodipine ", her husband says.   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 Her husband provided additional history prior to the visit and states that the patient thinks this visit is for her abdominal pain, however he had expressed concerns about memory to Dr. Orland Mustard. She states she thought the reason she was referred was for the tightness in her lower abdomen. We discussed her abdominal pain, she reports a tightness in her lower abdominal region that has been ongoing for at least a year. She states it is there all the time, but is not present today. She may take an Ibuprofen which would help. She was tried on Valium in the past which did help, "but no one would prescribe it for me." There is no associated nausea/vomiting, vaginal symptoms, bowel/bladder dysfunction, back pain, focal numbness/tingling/weakness in her extremities.    She states that her memory has never been that good, and keeps repeating "I think it's more of an attention issue." She states her memory is not terrible but she is somewhat forgetful. 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 She lives with her husband. Her husband started noticing changes 5-6 years ago. He states they would have the same conversation multiple times, asking the same question repeatedly. He states she either has a "total lack of memory or total attention deficit disorder, can't pay attention." She manages her own medications and denies missing any doses. Her husband reported she may get disoriented when she is out of routine when they travel. She stopped driving a year ago  due to her vision, she has less vision on her left eye, difficulty with peripheral vision on both eyes. Her husband notes she would get lost driving, she would make a wrong turn from her daughter's house and come home an hour later. There were several dents on the car. Her husband manages finances. She forgets to turn off the stove or oven, she admits to doing this yesterday but states this is not frequent. She misplaces things, her husband reports she would unload the dishwasher and leave all the cabinets open. She denies any word-finding difficulties. Her husband reports there were a couple of nights that she turns on the lights not knowing where she was, she said she was downstairs when they were in the bedroom. No paranoia or hallucinations. No significant personality changes, her husband notes when she misses Cymbalta she is more irritable and argumentative. She states her mood is fine. When their daughter got married in 2020, she was not interacting with the wedding party. She was found to have earwax and got hearing aids, but only wants to wear them when she watches TV at night. Sleep is good, no daytime drowsiness. Her mother had Alzheimer's disease in her 68s. She drinks a  glass of wine every night. No significant head injuries.    She denies any headaches, dizziness, diplopia, dysarthria, dysphagia, neck/back pain, focal numbness/tingling/weakness, bowel/bladder dysfunction. No anosmia, tremors, no falls. She endorses attention issues when younger, but none in college. She recalls her husband making her do a cognitive evaluation in [place] many years ago while she was still working full time, she does not recall results but they were "mixed results" and again states "I do have attention issues, I know that." She takes a daily B12 supplement for low B12 level.       Neurocognitive exam, 07/10/2020 Dr. Melvyn Novas Ms. Ringwald' pattern of performance is suggestive of a primary impairment surrounding learning  and memory, especially retrieval/consolidation aspects of memory. Performance variability was further exhibited across processing speed, executive functioning (with reasoning and safety/judgment abilities intact), and semantic fluency; however, the former may be impacted by visual acuity concerns. Performance was appropriate relative to age-matched peers across attention/concentration, receptive language, phonemic fluency, confrontation naming, and visuospatial abilities. Ms. Hoch generally denied difficulties completing instrumental activities of daily living (ADLs) independently. Her husband did not strongly contradict this. 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    Neuroimaging did not suggest prominent vascular changes to warrant consideration of a primary vascular etiology. I also do not see compelling evidence for Lewy body or frontotemporal dementia at the present time.  A repeat neuropsychological evaluation in 18-24 months (or sooner if functional decline is noted) is recommended to assess the trajectory of future cognitive decline should it occur. This will also aid in future efforts towards improved diagnostic clarity.     PREVIOUS MEDICATIONS:   CURRENT MEDICATIONS:  Outpatient Encounter Medications as of 01/19/2021  Medication Sig   azaTHIOprine (IMURAN) 50 MG tablet    cycloSPORINE modified (NEORAL) 50 MG capsule cyclosporine modified 50 mg capsule   donepezil (ARICEPT) 10 MG tablet Take 1 tablet daily   dorzolamide-timolol (COSOPT) 22.3-6.8 MG/ML ophthalmic solution dorzolamide 22.3 mg-timolol 6.8 mg/mL eye drops   DULoxetine (CYMBALTA) 20 MG capsule Take by mouth.   DULoxetine (CYMBALTA) 60 MG capsule duloxetine 60 mg capsule,delayed release   folic acid (FOLVITE) 1 MG tablet Take by mouth.   levothyroxine (SYNTHROID) 75 MCG tablet    No  facility-administered encounter medications on file as of 01/19/2021.     Objective:     PHYSICAL EXAMINATION:    VITALS:  There were no vitals filed for this visit.  GEN:  The patient appears stated age and is in NAD. HEENT:  Normocephalic, atraumatic.   Neurological examination:  General: NAD, well-groomed, appears stated age. Orientation: The patient is alert. Oriented to person, place, not to date. Cranial nerves: There is good facial symmetry.flat affect the speech is fluent and clear. No aphasia or dysarthria. Fund of knowledge is appropriate. Recent and remote memory are impaired. Attention and concentration are reduced.  Able to name objects and repeat phrases.  Hearing is intact to conversational tone.    Sensation: Sensation is intact to light touch throughout Motor: Strength is at least antigravity x4. Tremors: none  DTR's 2/4 in [facility]  05/04/2020  Visuospatial/ Executive (0/5) 3  Naming (0/3) 3  Attention: Read list of digits (0/2) 2  Attention: Read list of letters (0/1) 1  Attention: Serial 7 subtraction starting at 100 (0/3) 1  Language: Repeat phrase (0/2) 2  Language : Fluency (0/1) 0  Abstraction (0/2) 2  Delayed Recall (0/5) 0  Orientation (0/6) 2  Total 16   No flowsheet data found.  No flowsheet data found.     Movement examination: Tone: There is normal tone in the UE/LE Abnormal movements:  no tremor.  No myoclonus.  No asterixis.   Coordination:  There is no decremation with RAM's. Normal finger to nose  Gait and Station: The patient has no difficulty arising out of a deep-seated chair without the use of the hands. The patient's stride length is good.  Gait is cautious and narrow.    Total time spent on today's visit was 30 minutes, including both face-to-face time and nonface-to-face time. Time included that spent on review of records (prior notes available to me/labs/imaging if pertinent), discussing treatment  and goals, answering patient's questions and coordinating care.  Cc:  London Pepper, MD Sharene Butters, PA-C

## 2021-02-15 ENCOUNTER — Other Ambulatory Visit: Payer: Self-pay | Admitting: Family Medicine

## 2021-02-15 ENCOUNTER — Ambulatory Visit
Admission: RE | Admit: 2021-02-15 | Discharge: 2021-02-15 | Disposition: A | Discharge status: Home / Self Care | Payer: Medicare HMO | Source: Ambulatory Visit | Attending: Family Medicine | Admitting: Family Medicine

## 2021-02-15 DIAGNOSIS — R928 Other abnormal and inconclusive findings on diagnostic imaging of breast: Secondary | ICD-10-CM

## 2021-02-15 DIAGNOSIS — R922 Inconclusive mammogram: Secondary | ICD-10-CM | POA: Diagnosis not present

## 2021-02-21 ENCOUNTER — Other Ambulatory Visit: Payer: Medicare HMO

## 2021-02-27 ENCOUNTER — Other Ambulatory Visit: Payer: Self-pay

## 2021-02-27 ENCOUNTER — Ambulatory Visit
Admission: RE | Admit: 2021-02-27 | Discharge: 2021-02-27 | Disposition: A | Discharge status: Home / Self Care | Payer: Medicare HMO | Source: Ambulatory Visit | Attending: Family Medicine | Admitting: Family Medicine

## 2021-02-27 DIAGNOSIS — R928 Other abnormal and inconclusive findings on diagnostic imaging of breast: Secondary | ICD-10-CM

## 2021-02-27 DIAGNOSIS — N6315 Unspecified lump in the right breast, overlapping quadrants: Secondary | ICD-10-CM | POA: Diagnosis not present

## 2021-02-27 DIAGNOSIS — C50811 Malignant neoplasm of overlapping sites of right female breast: Secondary | ICD-10-CM | POA: Diagnosis not present

## 2021-03-13 ENCOUNTER — Ambulatory Visit: Payer: Self-pay | Admitting: General Surgery

## 2021-03-13 DIAGNOSIS — C50411 Malignant neoplasm of upper-outer quadrant of right female breast: Secondary | ICD-10-CM

## 2021-03-13 DIAGNOSIS — Z170 Estrogen receptor positive status [ER+]: Secondary | ICD-10-CM | POA: Insufficient documentation

## 2021-03-13 HISTORY — DX: Estrogen receptor positive status (ER+): Z17.0

## 2021-03-15 ENCOUNTER — Other Ambulatory Visit: Payer: Self-pay | Admitting: General Surgery

## 2021-03-15 ENCOUNTER — Other Ambulatory Visit: Payer: Self-pay | Admitting: *Deleted

## 2021-03-15 DIAGNOSIS — C50411 Malignant neoplasm of upper-outer quadrant of right female breast: Secondary | ICD-10-CM

## 2021-03-15 DIAGNOSIS — Z170 Estrogen receptor positive status [ER+]: Secondary | ICD-10-CM

## 2021-03-19 ENCOUNTER — Telehealth: Payer: Self-pay | Admitting: Hematology and Oncology

## 2021-03-19 NOTE — Telephone Encounter (Signed)
Scheduled appts per 1/16 referral. Spoke to pt's husband who is aware of both appts schedule. He is aware to have pt arrive 15 mins prior to appt time.

## 2021-03-26 NOTE — Progress Notes (Signed)
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 - 0.2 CM IN MAXIMUM EXTENT.  Receptor Status: ER(100%), PR (10%), Her2-neu (Negative), Ki-67(30%)  Did patient present with symptoms (if so, please note symptoms) or was this found on screening mammography?: Patient recently went for a routine screening mammogram. At that time she was found to have a 3 mm mass in the upper aspect of the right breast. The lymph nodes all looked normal  Past/Anticipated interventions by surgeon, if any:  04/06/2021 --Dr. Autumn Messing Scheduled for: RIGHT BREAST LUMPECTOMY WITH RADIOACTIVE SEED AND SENTINEL LYMPH NODE BIOPSY  Past/Anticipated interventions by medical oncology, if any:  Scheduled for consultation with Dr. Nicholas Lose on 03/29/2021  Lymphedema issues, if any:  Patient denies    Pain issues, if any:  Patient denies  SAFETY ISSUES: Prior radiation? No  Pacemaker/ICD? No Possible current pregnancy? No--postmenopausal Is the patient on methotrexate? No  Current Complaints / other details:  Nothing else of note

## 2021-03-26 NOTE — Progress Notes (Signed)
Radiation Oncology         ([phone]) [phone] ________________________________  Initial Outpatient Consultation  Name: Jordan Romero MRN: [id number]  Date: 03/27/2021  DOB: 1950/03/27  CC:Jordan Pepper, MD  Jordan Kussmaul, MD   REFERRING PHYSICIAN: [name], MD  DIAGNOSIS:    ICD-10-CM   1. Malignant neoplasm of upper-outer quadrant of right breast in female, estrogen receptor positive ([place])  C50.411    Z17.0       Cancer Staging  Malignant neoplasm of upper-outer quadrant of right breast in female, estrogen receptor positive ([place]) Staging form: Breast, AJCC 8th Edition - Clinical stage from 03/27/2021: Stage IA (cT1a, cN0, cM0, G3, ER+, PR+, HER2-) - Unsigned Histologic grading system: 3 grade system  Right Breast UOQ Invasive Ductal Carcinoma, ER+ / PR+ / Her2-, Grade 2-3  CHIEF COMPLAINT: Here to discuss management of right breast cancer  HISTORY OF PRESENT ILLNESS::Jordan Romero is a 71 y.o. female who presented with right breast abnormality on the following imaging: bilateral screening mammogram on the date of 01/18/21.  No symptoms, if any, were reported at that time.   Right breast diagnostic mammogram and ultrasound on 02/15/21 further revealed an indeterminate 2-3 mm mass in the right breast at the 12 o'clock position, 6 cmfn, and no evidence of right axillary lymphadenopathy.     Biopsy of the 12 o'clock right breast mass on the date of 02/27/21 showed grade 2-3 invasive ductal carcinoma, measuring 0.2 cm in the maximum extent.  ER status: 100% positive; PR status 10% positive, both with strong staining intensity, Her2 status negative; Proliferation marker Ki67 at 30%; Grade 2-3. (No lymph nodes were examined).   Subsequently, the patient was referred to Dr. Marlou Starks on 03/13/21 for further evaluation and to discuss treatment options. Physical exam performed during this visit revealed no palpable mass in either breast, and no palpable axillary, supraclavicular, or cervical  lymphadenopathy. Following discussion of the risks and benefits, the patient expressed interest in proceeding with right lumpectomy and SLN biopsies. In addition to referrals to radiation and medical oncology, Dr. Marlou Starks also placed referrals to genetics and PT for preoperative lymphedema testing.   The patient is currently scheduled for breast conserving surgery and SLN biopsies on 04/06/21.  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    PAST SURGICAL HISTORY:History reviewed. No pertinent surgical history.  FAMILY HISTORY: family history includes Alzheimer's disease in her mother., colon cancer in her mother and father, possible leukemia in a sister, and an unknown cancer in her paternal grandmother.  SOCIAL HISTORY:  reports that she quit smoking about 7 years ago. Her smoking use included cigarettes. She has never used smokeless tobacco. She reports current alcohol use of about 7.0 standard drinks per week. She reports that she does not use drugs.  ALLERGIES: Patient has no known allergies.  MEDICATIONS:  Current Outpatient Medications  Medication Sig Dispense Refill   Multiple Vitamin (MULTIVITAMIN) tablet Take 1 tablet by mouth daily.     Omega-3 Fatty Acids (FISH OIL PO) Take by mouth.  azaTHIOprine (IMURAN) 50 MG tablet      donepezil  (ARICEPT) 10 MG tablet Take 1 tablet daily 90 tablet 3   dorzolamide-timolol (COSOPT) 22.3-6.8 MG/ML ophthalmic solution dorzolamide 22.3 mg-timolol 6.8 mg/mL eye drops     DULoxetine (CYMBALTA) 20 MG capsule Take by mouth.     DULoxetine (CYMBALTA) 60 MG capsule duloxetine 60 mg capsule,delayed release     levothyroxine (SYNTHROID) 75 MCG tablet      No current facility-administered medications for this encounter.    REVIEW OF SYSTEMS: As above in HPI.   PHYSICAL EXAM:  height is '5\' 5"'  (1.651 m) and weight is 221 lb 3.2 oz (100.3 kg). Her temperature is 97.8 F (36.6 C). Her blood pressure is 132/86 and her pulse is 54 (abnormal). Her respiration is 20 and oxygen saturation is 100%.   General: Alert and oriented, in no acute distress HEENT: Head is normocephalic. Extraocular movements are intact.  Heart: Regular in rate and rhythm with no murmurs, rubs, or gallops. Chest: Clear to auscultation bilaterally, with no rhonchi, wheezes, or rales. Abdomen: Soft, nontender, nondistended, with no rigidity or guarding. Skin: No concerning lesions. Musculoskeletal: symmetric strength and muscle tone throughout. Neurologic: Cranial nerves II through XII are grossly intact. No obvious focalities. Speech is fluent. Coordination is intact. Psychiatric: Judgment and insight are intact. Affect is appropriate. Breasts: no palpable masses appreciated in the breasts or axillae bilaterally   ECOG = 1  0 - Asymptomatic (Fully active, able to carry on all predisease activities without restriction)  1 - Symptomatic but completely ambulatory (Restricted in physically strenuous activity but ambulatory and able to carry out work of a light or sedentary nature. For example, light housework, office work)  2 - Symptomatic, <50% in bed during the day (Ambulatory and capable of all self care but unable to carry out any work activities. Up and about more than 50% of waking hours)  3 - Symptomatic, >50% in bed, but  not bedbound (Capable of only limited self-care, confined to bed or chair 50% or more of waking hours)  4 - Bedbound (Completely disabled. Cannot carry on any self-care. Totally confined to bed or chair)  5 - Death   Eustace Pen MM, Creech RH, Tormey DC, et al. [phone]). "Toxicity and response criteria of the [facility]". [place] Oncol. 5 (6): 649-55   LABORATORY DATA:  Lab Results  Component Value Date   WBC 6.4 02/12/2020   HGB 14.8 02/12/2020   HCT 47.3 (H) 02/12/2020   MCV 97.1 02/12/2020   PLT 192 02/12/2020   CMP     Component Value Date/Time   NA 141 02/12/2020 1559   K 4.1 02/12/2020 1559   CL 107 02/12/2020 1559   CO2 24 02/12/2020 1559   GLUCOSE 109 (H) 02/12/2020 1559   BUN 19 02/12/2020 1559   CREATININE 1.04 (H) 02/12/2020 1559   CALCIUM 9.6 02/12/2020 1559   PROT 6.5 02/12/2020 1559   ALBUMIN 3.7 02/12/2020 1559   AST 21 02/12/2020 1559   ALT 13 02/12/2020 1559   ALKPHOS 62 02/12/2020 1559   BILITOT 0.8 02/12/2020 1559   GFRNONAA 57 (L) 02/12/2020 1559         RADIOGRAPHY: MM CLIP PLACEMENT RIGHT  Result Date: 02/27/2021 CLINICAL DATA:  Evaluate biopsy marker EXAM: 3D DIAGNOSTIC RIGHT MAMMOGRAM POST ULTRASOUND BIOPSY COMPARISON:  Previous exam(s). FINDINGS: 3D Mammographic images were obtained following ultrasound guided biopsy of a right breast mass at 12 o'clock. The mass is  very difficult to see after biopsy. The biopsy clip appears to correlate with the biopsied mass. The clip may be slightly superior to the biopsied mass. IMPRESSION: The biopsy clip appears to correlate with the biopsied mass. The ribbon shaped clip may be slightly superior to the biopsied mass. Final Assessment: Post Procedure Mammograms for Marker Placement Electronically Signed   By: Dorise Bullion Romero M.D.   On: 02/27/2021 12:21  [place] RT BREAST BX W LOC DEV 1ST LESION IMG BX SPEC US GUIDE  Addendum Date: 03/16/2021   ADDENDUM REPORT: 03/16/2021 07:54 ADDENDUM:  Pathology revealed GRADE II-Romero INVASIVE DUCTAL CARCINOMA of the RIGHT breast, 12:00 o'clock, (ribbon clip). This was found to be concordant by Dr. Dorise Bullion. Pathology results were discussed with the patient by telephone by Stacie Acres, RN Nurse Navigator. The patient reported doing well after the biopsy with tenderness at the site. Post biopsy instructions and care were reviewed and questions were answered. The patient was encouraged to call [facility] for any additional concerns. My direct phone number was provided. Surgical consultation has been arranged with Dr. Autumn Messing at [facility] on March 13, 2021. Pathology results reported by Terie Purser, RN on 03/15/2021. Electronically Signed   By: Dorise Bullion Romero M.D.   On: 03/16/2021 07:54   Result Date: 03/16/2021 CLINICAL DATA:  Biopsy of a 12 o'clock right breast mass EXAM: ULTRASOUND GUIDED RIGHT BREAST CORE NEEDLE BIOPSY COMPARISON:  Previous exam(s). PROCEDURE: I met with the patient and we discussed the procedure of ultrasound-guided biopsy, including benefits and alternatives. We discussed the high likelihood of a successful procedure. We discussed the risks of the procedure, including infection, bleeding, tissue injury, clip migration, and inadequate sampling. Informed written consent was given. The usual time-out protocol was performed immediately prior to the procedure. Lesion quadrant: 12 o'clock right breast, 6 cm from the nipple Using sterile technique and 1% Lidocaine as local anesthetic, under direct ultrasound visualization, a 12 gauge spring-loaded device was used to perform biopsy of a mass in the right breast at 12 o'clock, 6 cm from the nipple using a lateral approach. At the conclusion of the procedure a ribbon shaped tissue marker clip was deployed into the biopsy cavity. Follow up 2 view mammogram was performed and dictated separately. IMPRESSION: Ultrasound guided biopsy of a 12 o'clock  right breast mass, 6 cm from the. No apparent complications. Electronically Signed: By: Dorise Bullion Romero M.D. On: 02/27/2021 12:10     IMPRESSION/PLAN:  Right breast cancer, ER+, node negative  It was a pleasure meeting the patient today. We discussed the risks, benefits, and side effects of radiotherapy. I recommend radiotherapy to the right breast to reduce her risk of locoregional recurrence by 2/3.  We discussed that radiation would take approximately 1-4 weeks to complete and that I would give the patient a few weeks to heal following surgery before starting treatment planning. If chemotherapy were to be given (unlikely), this would precede radiotherapy. We spoke about acute effects including skin irritation and fatigue as well as much less common late effects including internal organ injury or irritation. We spoke about the latest technology that is used to minimize the risk of late effects for patients undergoing radiotherapy to the breast or chest wall. No guarantees of treatment were given. The patient is enthusiastic about proceeding with treatment. I look forward to participating in the patient's care.  I will await her referral back to me for postoperative follow-up and eventual  CT simulation/treatment planning.  Family history of cancers: genetic counseling pending.  On date of service, in total, I spent 45 minutes on this encounter. Patient was seen in person.   __________________________________________   Eppie Gibson, MD  This document serves as a record of services personally performed by Eppie Gibson, MD. It was created on her behalf by Roney Mans, a trained medical scribe. The creation of this record is based on the scribe's personal observations and the provider's statements to them. This document has been checked and approved by the attending provider.

## 2021-03-27 ENCOUNTER — Ambulatory Visit
Admission: RE | Admit: 2021-03-27 | Discharge: 2021-03-27 | Disposition: A | Discharge status: Home / Self Care | Payer: Medicare HMO | Source: Ambulatory Visit | Attending: Radiation Oncology | Admitting: Radiation Oncology

## 2021-03-27 ENCOUNTER — Encounter: Payer: Self-pay | Admitting: Radiation Oncology

## 2021-03-27 ENCOUNTER — Other Ambulatory Visit: Payer: Self-pay

## 2021-03-27 VITALS — BP 132/86 | HR 54 | Temp 97.8°F | Resp 20 | Ht 65.0 in | Wt 221.2 lb

## 2021-03-27 DIAGNOSIS — Z87440 Personal history of urinary (tract) infections: Secondary | ICD-10-CM | POA: Insufficient documentation

## 2021-03-27 DIAGNOSIS — E785 Hyperlipidemia, unspecified: Secondary | ICD-10-CM | POA: Insufficient documentation

## 2021-03-27 DIAGNOSIS — Z170 Estrogen receptor positive status [ER+]: Secondary | ICD-10-CM | POA: Diagnosis not present

## 2021-03-27 DIAGNOSIS — Z8719 Personal history of other diseases of the digestive system: Secondary | ICD-10-CM | POA: Diagnosis not present

## 2021-03-27 DIAGNOSIS — H3552 Pigmentary retinal dystrophy: Secondary | ICD-10-CM | POA: Insufficient documentation

## 2021-03-27 DIAGNOSIS — C50411 Malignant neoplasm of upper-outer quadrant of right female breast: Secondary | ICD-10-CM | POA: Insufficient documentation

## 2021-03-27 DIAGNOSIS — E039 Hypothyroidism, unspecified: Secondary | ICD-10-CM | POA: Insufficient documentation

## 2021-03-27 DIAGNOSIS — Z79899 Other long term (current) drug therapy: Secondary | ICD-10-CM | POA: Diagnosis not present

## 2021-03-27 DIAGNOSIS — I10 Essential (primary) hypertension: Secondary | ICD-10-CM | POA: Insufficient documentation

## 2021-03-27 DIAGNOSIS — Z87891 Personal history of nicotine dependence: Secondary | ICD-10-CM | POA: Diagnosis not present

## 2021-03-28 ENCOUNTER — Encounter (HOSPITAL_BASED_OUTPATIENT_CLINIC_OR_DEPARTMENT_OTHER): Payer: Self-pay | Admitting: General Surgery

## 2021-03-28 ENCOUNTER — Other Ambulatory Visit: Payer: Self-pay

## 2021-03-28 NOTE — Progress Notes (Signed)
[place] NOTE  Patient Care Team: London Pepper, MD as PCP - General (Family Medicine) Cameron Sprang, MD as Consulting Physician (Neurology) Mauro Kaufmann, RN as Oncology Nurse Navigator Rockwell Germany, RN as Oncology Nurse Navigator  CHIEF COMPLAINTS/PURPOSE OF CONSULTATION:  Newly diagnosed right breast cancer  HISTORY OF PRESENTING ILLNESS:  Jordan Romero 71 y.o. female is here because of recent diagnosis of invasive ductal carcinoma of the right breast. Screening mammogram on 01/18/2021 showed a possible mass in the right breast. Diagnostic mammogram and [place] on 02/15/2021 showed an indeterminate 2 mm mass in the right breast at 12 o'clock. Biopsy on 02/27/2021 showed grade 2 to 3 invasive ductal carcinoma, ER+(100%)/PR+(10%)/Her2-. She presents to the clinic today for initial evaluation and discussion of treatment options.   I reviewed her records extensively and collaborated the history with the patient.  SUMMARY OF ONCOLOGIC HISTORY: Oncology History  Malignant neoplasm of upper-outer quadrant of right breast in female, estrogen receptor positive ([place])  02/27/2021 Initial Diagnosis   Screening mammogram: a possible mass in the right breast. Diagnostic mammogram: an indeterminate 2 mm mass in the right breast at 12 o'clock. 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  Ki-67 30%   03/27/2021 Cancer Staging   Staging form: Breast, AJCC 8th Edition - Clinical stage from 03/27/2021: Stage IA (cT1a, cN0, cM0, G3, ER+, PR+, HER2-) - Signed by Nicholas Lose, MD on 03/29/2021 Histologic grading system: 3 grade system      MEDICAL HISTORY:  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   Family history of colon cancer    Hiatal hernia 02/14/2016   Noted on  imaging by chiropractor   History of colonic polyps    Hyperlipidemia    Hypertension    Insomnia    Knee pain, bilateral 07/04/2020   Major depressive disorder in remission 07/14/2013   MCI (mild cognitive impairment) 07/10/2020   Pain in pelvis    Peripheral focal chorioretinal inflammation of both eyes 12/19/2017   Pseudophakia of both eyes 12/19/2017   Recurrent urinary tract infection    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    SURGICAL HISTORY: Past Surgical History:  Procedure Laterality Date   ABDOMINAL HYSTERECTOMY     CESAREAN SECTION      SOCIAL HISTORY: Social History   Socioeconomic History   Marital status: Married    Spouse name: Not on file   Number of children: Not on file   Years of education: 18   Highest education level: Master's degree (e.g., MA, MS, MEng, MEd, MSW, MBA)  Occupational History   Occupation: Retired  Tobacco Use   Smoking status: Former    Types: Cigarettes    Quit date: 2016    Years since quitting: 7.0   Smokeless tobacco: Never   Tobacco comments:    Quit for 28 years, started in 2014 again and quit again in 2016.  Vaping Use   Vaping Use: Never used  Substance and Sexual Activity   Alcohol use: Yes    Alcohol/week: 7.0 standard drinks    Types: 7 Glasses of wine per week    Comment: One glass of wine a day   Drug use: Never   Sexual activity: Not Currently    Birth control/protection: Post-menopausal  Other Topics Concern  Not on file  Social History Narrative   Right handed   Lives in two story town home with husband.   Drinks Caffeine One cup daily   Social Determinants of Health   Financial Resource Strain: Not on file  Food Insecurity: Not on file  Transportation Needs: Not on file  Physical Activity: Not on file  Stress: Not on file  Social Connections: Not on file  Intimate Partner Violence: Not on file    FAMILY HISTORY: Family History  Problem Relation Age of Onset   Alzheimer's disease  Mother        symptom onset in 83s   Colon cancer Mother        dx 16s   Colon cancer Father        d. 66   Leukemia Sister    Cancer Paternal Aunt        NOS   Dementia Maternal Grandmother    Cancer Paternal Grandmother        NOS - "womans cancer"    ALLERGIES:  has No Known Allergies.  MEDICATIONS:  Current Outpatient Medications  Medication Sig Dispense Refill   azaTHIOprine (IMURAN) 50 MG tablet      donepezil (ARICEPT) 10 MG tablet Take 1 tablet daily 90 tablet 3   dorzolamide-timolol (COSOPT) 22.3-6.8 MG/ML ophthalmic solution dorzolamide 22.3 mg-timolol 6.8 mg/mL eye drops     DULoxetine (CYMBALTA) 20 MG capsule Take by mouth.     DULoxetine (CYMBALTA) 60 MG capsule duloxetine 60 mg capsule,delayed release     levothyroxine (SYNTHROID) 75 MCG tablet      LISINOPRIL PO Take by mouth.     Multiple Vitamin (MULTIVITAMIN) tablet Take 1 tablet by mouth daily.     Omega-3 Fatty Acids (FISH OIL PO) Take by mouth.     No current facility-administered medications for this visit.    REVIEW OF SYSTEMS:   Constitutional: Denies fevers, chills or abnormal night sweats Eyes: Denies blurriness of vision, double vision or watery eyes Ears, nose, mouth, throat, and face: Denies mucositis or sore throat Respiratory: Denies cough, dyspnea or wheezes Cardiovascular: Denies palpitation, chest discomfort or lower extremity swelling Gastrointestinal:  Denies nausea, heartburn or change in bowel habits Skin: Denies abnormal skin rashes Lymphatics: Denies new lymphadenopathy or easy bruising Neurological:Denies numbness, tingling or new weaknesses Behavioral/Psych: Mood is stable, no new changes  Breast:  Denies any palpable lumps or discharge All other systems were reviewed with the patient and are negative.  PHYSICAL EXAMINATION: ECOG PERFORMANCE STATUS: 1 - Symptomatic but completely ambulatory  Vitals:   03/29/21 1556  BP: 118/62  Pulse: 65  Resp: 18  Temp: (!) 97.2 F (36.2  C)  SpO2: 100%   Filed Weights   03/29/21 1556  Weight: 220 lb 8 oz (100 kg)      LABORATORY DATA:  I have reviewed the data as listed Lab Results  Component Value Date   WBC 6.4 02/12/2020   HGB 14.8 02/12/2020   HCT 47.3 (H) 02/12/2020   MCV 97.1 02/12/2020   PLT 192 02/12/2020   Lab Results  Component Value Date   NA 141 02/12/2020   K 4.1 02/12/2020   CL 107 02/12/2020   CO2 24 02/12/2020    RADIOGRAPHIC STUDIES: I have personally reviewed the radiological reports and agreed with the findings in the report.  ASSESSMENT AND PLAN:  Malignant neoplasm of upper-outer quadrant of right breast in female, estrogen receptor positive ([place]) 02/27/2021:Screening mammogram: a possible mass in the  right breast. Diagnostic mammogram: an indeterminate 2 mm mass in the right breast at 12 o'clock. 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  Ki-67 30%    Pathology and radiology counseling:Discussed with the patient, the details of pathology including the type of breast cancer,the clinical staging, the significance of ER, PR and HER-2/neu receptors and the implications for treatment. After reviewing the pathology in detail, we proceeded to discuss the different treatment options between surgery, radiation, antiestrogen therapies.  Recommendations: 1. Breast conserving surgery followed by 2. Adjuvant radiation therapy followed by 3. Adjuvant antiestrogen therapy  If the final path shows IDC that is less than 5 mm, there is no role for Oncotype testing. Telephone visit after surgery to discuss final pathology report    All questions were answered. The patient knows to call the clinic with any problems, questions or concerns.   Rulon Eisenmenger, MD, MPH 03/29/2021    I, Thana Ates, am acting as scribe for Nicholas Lose, MD.  I have reviewed the above documentation for accuracy and completeness, and I agree with the above.

## 2021-03-29 ENCOUNTER — Encounter: Payer: Self-pay | Admitting: Genetic Counselor

## 2021-03-29 ENCOUNTER — Other Ambulatory Visit: Payer: Self-pay | Admitting: Genetic Counselor

## 2021-03-29 ENCOUNTER — Inpatient Hospital Stay: Payer: Medicare HMO

## 2021-03-29 ENCOUNTER — Inpatient Hospital Stay: Payer: Medicare HMO | Attending: Hematology and Oncology | Admitting: Hematology and Oncology

## 2021-03-29 ENCOUNTER — Inpatient Hospital Stay (HOSPITAL_BASED_OUTPATIENT_CLINIC_OR_DEPARTMENT_OTHER): Payer: Medicare HMO | Admitting: Genetic Counselor

## 2021-03-29 DIAGNOSIS — Z170 Estrogen receptor positive status [ER+]: Secondary | ICD-10-CM | POA: Diagnosis not present

## 2021-03-29 DIAGNOSIS — C50411 Malignant neoplasm of upper-outer quadrant of right female breast: Secondary | ICD-10-CM | POA: Diagnosis not present

## 2021-03-29 DIAGNOSIS — Z79899 Other long term (current) drug therapy: Secondary | ICD-10-CM | POA: Insufficient documentation

## 2021-03-29 DIAGNOSIS — Z800 Family history of malignant neoplasm of digestive organs: Secondary | ICD-10-CM

## 2021-03-29 LAB — GENETIC SCREENING ORDER

## 2021-03-29 NOTE — Progress Notes (Signed)
REFERRING PROVIDER: Jovita Kussmaul, MD [address] [place],  [place] [address]  PRIMARY PROVIDER:  London Pepper, MD  PRIMARY REASON FOR VISIT:  1. Family history of colon cancer   2. Malignant neoplasm of upper-outer quadrant of right breast in female, estrogen receptor positive ([place])      HISTORY OF PRESENT ILLNESS:   Jordan Romero, a 71 y.o. female, was seen for a [place] cancer genetics consultation at the request of Dr. Marlou Starks due to a personal and family history of cancer.  Jordan Romero presents to clinic today to discuss the possibility of a hereditary predisposition to cancer, genetic testing, and to further clarify her future cancer risks, as well as potential cancer risks for family members.   In December 2022, at the age of 27, Jordan Romero was diagnosed with cancer of the right breast. The treatment plan includes lumpectomy and radiation.    CANCER HISTORY:  Oncology History  Malignant neoplasm of upper-outer quadrant of right breast in female, estrogen receptor positive ([place])  02/27/2021 Initial Diagnosis   Screening mammogram: a possible mass in the right breast. Diagnostic mammogram: an indeterminate 2 mm mass in the right breast at 12 o'clock. 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  Ki-67 30%   03/27/2021 Cancer Staging   Staging form: Breast, AJCC 8th Edition - Clinical stage from 03/27/2021: Stage IA (cT1a, cN0, cM0, G3, ER+, PR+, HER2-) - Signed by Nicholas Lose, MD on 03/29/2021 Histologic grading system: 3 grade system       RISK FACTORS:  Menarche was at age 72.  First live birth at age 23.  OCP use for approximately 0 years.  Ovaries intact: yes.  Hysterectomy: no.  Menopausal status: postmenopausal.  HRT use: 0 years. Colonoscopy: yes;  at least 10 polyps in her lifetime . Mammogram within the last year: yes. Number of breast biopsies: 1. Up to date with pelvic exams: no. 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   Pseudophakia of both eyes 12/19/2017   Recurrent urinary tract infection    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    Past Surgical History:  Procedure Laterality Date   ABDOMINAL HYSTERECTOMY     CESAREAN SECTION      Social History   Socioeconomic History   Marital status: Married    Spouse name: Not on file   Number of children: Not on file   Years of education: 37   Highest education level: Master's degree (e.g., MA, MS, MEng, MEd, MSW, MBA)  Occupational History   Occupation: Retired  Tobacco Use   Smoking status: Former    Types: Cigarettes    Quit date: 2016    Years since quitting: 7.0   Smokeless tobacco: Never   Tobacco comments:    Quit for 28 years, started  in 2014 again and quit again in 2016.  Vaping Use   Vaping Use: Never used  Substance and Sexual Activity   Alcohol use: Yes    Alcohol/week: 7.0 standard drinks    Types: 7 Glasses of wine per week    Comment: One glass of wine a day   Drug use: Never   Sexual activity: Not Currently    Birth control/protection: Post-menopausal  Other Topics Concern   Not on file  Social History Narrative   Right handed   Lives in two story town home with husband.   Drinks Caffeine One cup daily   Social Determinants of Health   Financial Resource Strain: Not on file  Food Insecurity: Not  on file  Transportation Needs: Not on file  Physical Activity: Not on file  Stress: Not on file  Social Connections: Not on file     FAMILY HISTORY:  We obtained a detailed, 4-generation family history.  Significant diagnoses are listed below: Family History  Problem Relation Age of Onset   Alzheimer's disease Mother        symptom onset in 29s   Colon cancer Mother        dx 91s   Colon cancer Father        d. 56   Leukemia Sister    Cancer Paternal Aunt        NOS   Dementia Maternal Grandmother    Cancer Paternal Grandmother        NOS - "womans cancer"    The patient has one daughter who is cancer free.  She has one brother and one sister, her sister has a precursor to leukemia.  Both parents are deceased.  The patient's mother had colon cancer in her 44's.  She had two sisters who are cancer free.  Her parents are deceased from non-cancer related issues.  The patient's father had colon cancer and died at 74.  He had one sister and two brothers.  The sister died of an unknown cancer.  His parents are deceased.  The mother died of an unknown cancer under the age of 23's.  Jordan Romero is unaware of previous family history of genetic testing for hereditary cancer risks. Patient's maternal ancestors are of [place] and [place] descent, and paternal ancestors are of [place] and [place] descent. There is no reported Ashkenazi Jewish ancestry. There is no known consanguinity.  GENETIC COUNSELING ASSESSMENT: Jordan Romero is a 71 y.o. female with a personal and family history of cancer which is somewhat suggestive of a hereditary cancer syndrome and predisposition to cancer given some of the younger ages of onset. We, therefore, discussed and recommended the following at today's visit.   DISCUSSION: We discussed that, in general, most cancer is not inherited in families, but instead is sporadic or familial. Sporadic cancers occur by chance and typically happen at older ages (>50 years) as  this type of cancer is caused by genetic changes acquired during an individuals lifetime. Some families have more cancers than would be expected by chance; however, the ages or types of cancer are not consistent with a known genetic mutation or known genetic mutations have been ruled out. This type of familial cancer is thought to be due to a combination of multiple genetic, environmental, hormonal, and lifestyle factors. While this combination of factors likely increases the risk of cancer, the exact source of this risk is not currently identifiable or testable.  We discussed that 5 - 10% of breast cancer  is hereditary, with most cases associated with BRCA mutations.  There are other genes that can be associated with hereditary breast cancer syndromes.  These include ATM, CHEK2 and PALB2.  We discussed that testing is beneficial for several reasons including knowing how to follow individuals after completing their treatment, identifying whether potential treatment options such as PARP inhibitors would be beneficial, and understand if other family members could be at risk for cancer and allow them to undergo genetic testing.   We reviewed the characteristics, features and inheritance patterns of hereditary cancer syndromes. We also discussed genetic testing, including the appropriate family members to test, the process of testing, insurance coverage and turn-around-time for results. We discussed the implications of a negative, positive, carrier and/or variant of uncertain significant result. We recommended Jordan Romero pursue genetic testing for the CancerNext-Expanded+RNAinsight gene panel.   The CancerNext-Expanded gene panel offered by [facility] and includes sequencing and rearrangement analysis for the following 77 genes: AIP, ALK, APC*, ATM*, AXIN2, BAP1, BARD1, BLM, BMPR1A, BRCA1*, BRCA2*, BRIP1*, CDC73, CDH1*, CDK4, CDKN1B, CDKN2A, CHEK2*, CTNNA1, DICER1, FANCC, FH, FLCN, GALNT12, KIF1B, LZTR1, MAX,  MEN1, MET, MLH1*, MSH2*, MSH3, MSH6*, MUTYH*, NBN, NF1*, NF2, NTHL1, PALB2*, PHOX2B, PMS2*, POT1, PRKAR1A, PTCH1, PTEN*, RAD51C*, RAD51D*, RB1, RECQL, RET, SDHA, SDHAF2, SDHB, SDHC, SDHD, SMAD4, SMARCA4, SMARCB1, SMARCE1, STK11, SUFU, TMEM127, TP53*, TSC1, TSC2, VHL and XRCC2 (sequencing and deletion/duplication); EGFR, EGLN1, HOXB13, KIT, MITF, PDGFRA, POLD1, and POLE (sequencing only); EPCAM and GREM1 (deletion/duplication only). DNA and RNA analyses performed for * genes.   Based on Jordan Romero's personal and family history of cancer, she meets medical criteria for genetic testing. Despite that she meets criteria, she may still have an out of pocket cost. We discussed that if her out of pocket cost for testing is over $100, the laboratory will call and confirm whether she wants to proceed with testing.  If the out of pocket cost of testing is less than $100 she will be billed by the genetic testing laboratory.   PLAN: After considering the risks, benefits, and limitations, Jordan Romero provided informed consent to pursue genetic testing and the blood sample was sent to [facility] for analysis of the CancerNext-Expanded+RNAinsight. Results should be available within approximately 2-3 weeks' time, at which point they will be disclosed by telephone to Jordan Romero, as will any additional recommendations warranted by these results. Jordan Romero will receive a summary of her genetic counseling visit and a copy of her results once available. This information will also be available in Epic.   Lastly, we encouraged Jordan Romero to remain in contact with cancer genetics annually so that we can continuously update the family history and inform her of any changes in cancer genetics and testing that may be of benefit for this family.   Jordan Romero questions were answered to her satisfaction today. Our contact information was provided should additional questions or concerns arise. Thank you for the referral  and allowing [place] to share in the care of your patient.   Avey Mcmanamon P. Florene Glen, [place], [facility] Licensed, Insurance risk surveyor [email or web address] phone: [phone]  The patient was seen for a total of 45 minutes in face-to-face genetic counseling.  The patient brought her husband, Jordan Romero. This patient was discussed with Drs. Magrinat, Lindi Adie and/or Burr Medico who agrees with the above.    _______________________________________________________________________ For Office Staff:  Number of people involved in session: 2 Was an Intern/ student involved with case: no

## 2021-03-29 NOTE — Assessment & Plan Note (Signed)
02/27/2021:Screening mammogram: a possible mass in the right breast. Diagnostic mammogram: an indeterminate 2 mm mass in the right breast at 12 o'clock. 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  Ki-67 30%    Pathology and radiology counseling:Discussed with the patient, the details of pathology including the type of breast cancer,the clinical staging, the significance of ER, PR and HER-2/neu receptors and the implications for treatment. After reviewing the pathology in detail, we proceeded to discuss the different treatment options between surgery, radiation, antiestrogen therapies.  Recommendations: 1. Breast conserving surgery followed by 2. Adjuvant radiation therapy followed by 3. Adjuvant antiestrogen therapy  If the final path shows IDC that is less than 5 mm, there is no role for Oncotype testing. Return to clinic after surgery to discuss final pathology report

## 2021-03-30 ENCOUNTER — Encounter: Payer: Self-pay | Admitting: *Deleted

## 2021-03-30 ENCOUNTER — Telehealth: Payer: Self-pay | Admitting: *Deleted

## 2021-03-30 NOTE — Telephone Encounter (Signed)
Spoke to pt husband Jenny Reichmann provided navigation resources and contact information. Denies questions or concerns regarding dx or treatment care plan. Encourage pt to call with needs. Received verbal understanding.

## 2021-04-02 ENCOUNTER — Ambulatory Visit: Payer: Medicare HMO | Attending: General Surgery | Admitting: Physical Therapy

## 2021-04-02 ENCOUNTER — Encounter (HOSPITAL_BASED_OUTPATIENT_CLINIC_OR_DEPARTMENT_OTHER)
Admission: RE | Admit: 2021-04-02 | Discharge: 2021-04-02 | Disposition: A | Discharge status: Home / Self Care | Payer: Medicare HMO | Source: Ambulatory Visit | Attending: General Surgery | Admitting: General Surgery

## 2021-04-02 ENCOUNTER — Encounter: Payer: Self-pay | Admitting: Physical Therapy

## 2021-04-02 ENCOUNTER — Other Ambulatory Visit: Payer: Self-pay

## 2021-04-02 DIAGNOSIS — F32A Depression, unspecified: Secondary | ICD-10-CM | POA: Diagnosis not present

## 2021-04-02 DIAGNOSIS — Z170 Estrogen receptor positive status [ER+]: Secondary | ICD-10-CM | POA: Diagnosis not present

## 2021-04-02 DIAGNOSIS — R293 Abnormal posture: Secondary | ICD-10-CM | POA: Diagnosis not present

## 2021-04-02 DIAGNOSIS — K449 Diaphragmatic hernia without obstruction or gangrene: Secondary | ICD-10-CM | POA: Diagnosis not present

## 2021-04-02 DIAGNOSIS — Z87891 Personal history of nicotine dependence: Secondary | ICD-10-CM | POA: Diagnosis not present

## 2021-04-02 DIAGNOSIS — C50411 Malignant neoplasm of upper-outer quadrant of right female breast: Secondary | ICD-10-CM | POA: Diagnosis not present

## 2021-04-02 DIAGNOSIS — E039 Hypothyroidism, unspecified: Secondary | ICD-10-CM | POA: Diagnosis not present

## 2021-04-02 DIAGNOSIS — I10 Essential (primary) hypertension: Secondary | ICD-10-CM | POA: Diagnosis not present

## 2021-04-02 NOTE — Therapy (Signed)
[facility] [address] [place], [place], [address] Phone: [phone]   Fax:  [phone]  Physical Therapy Evaluation  Patient Details  Name: Jordan Romero MRN: [id number] Date of Birth: 1950-11-13 Referring Provider (PT): Dr. 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   Pseudophakia of both eyes 12/19/2017   Recurrent urinary tract infection    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    Past Surgical History:  Procedure Laterality Date   ABDOMINAL HYSTERECTOMY     CESAREAN SECTION      There were no vitals filed for this visit.    Subjective Assessment - 04/02/21 1548     Subjective I don't have any shoulder issues. Sometimes I get a trigger finger.    Patient is accompained by: Family member    Pertinent History R breast cancer, IDC R breast grade 2-3 ER+,PR+, Her2-, plan is to undergo lumpectomy and SLNB on 04/06/21, will need radiation and anti estrogen    Patient Stated Goals to gain  info from provider    Currently in Pain? No/denies                [facility] PT Assessment - 04/02/21 0001       Assessment   Medical Diagnosis R breast cancer    Referring Provider (PT) Dr. Marlou Starks    Onset Date/Surgical Date 04/06/21    Hand Dominance Right    Prior Therapy none      Precautions   Precautions Other (comment)    Precaution Comments active cancer      Restrictions   Weight Bearing Restrictions No      Balance Screen   Has the patient fallen in the past 6 months No    Has the patient had a decrease in activity level because of a fear of falling?  No    Is the patient reluctant to leave their home because of a fear of falling?  No      Home Environment   Living Environment Private residence    Living Arrangements Spouse/significant other    Available Help at Discharge Family    Type of [place];Other (comment)   unable to drive due to vision   Vocation Retired  Leisure pt walks 5 days/wk for 30 min      Cognition   Overall Cognitive Status Within Functional Limits for tasks assessed      Functional Tests   Functional tests Sit to Stand      Sit to Stand   Comments 30 sec sit to stand: 17 reps without use of hands      Posture/Postural Control   Posture/Postural Control Postural limitations    Postural Limitations Rounded Shoulders;Forward head      ROM / Strength   AROM / PROM / Strength AROM      AROM   AROM Assessment Site Shoulder    Right/Left Shoulder Left;Right    Right Shoulder Extension 54 Degrees    Right Shoulder Flexion 161 Degrees    Right Shoulder ABduction 161 Degrees    Right Shoulder Internal Rotation 64 Degrees    Right Shoulder External Rotation 74 Degrees    Left Shoulder Extension 64 Degrees    Left Shoulder Flexion 150 Degrees    Left Shoulder ABduction 153 Degrees    Left Shoulder Internal Rotation 71 Degrees    Left Shoulder External Rotation 70  Degrees               LYMPHEDEMA/ONCOLOGY QUESTIONNAIRE - 04/02/21 0001       Type   Cancer Type R breast cancer      Lymphedema Assessments   Lymphedema Assessments Upper extremities      Right Upper Extremity Lymphedema   15 cm Proximal to Olecranon Process 37.2 cm    Olecranon Process 27.6 cm    15 cm Proximal to Ulnar Styloid Process 25.9 cm    Just Proximal to Ulnar Styloid Process 16.5 cm    Across Hand at [employer] 18 cm    At [place] of 2nd Digit 6 cm      Left Upper Extremity Lymphedema   15 cm Proximal to Olecranon Process 38.5 cm    Olecranon Process 27.5 cm    15 cm Proximal to Ulnar Styloid Process 27.2 cm    Just Proximal to Ulnar Styloid Process 18 cm    Across Hand at [employer] 18.5 cm    At [place] of 2nd Digit 6 cm             L-DEX FLOWSHEETS - 04/02/21 1600       L-DEX LYMPHEDEMA SCREENING   Measurement Type Unilateral    L-DEX MEASUREMENT EXTREMITY Upper Extremity    POSITION  Standing    DOMINANT SIDE Right    At Risk Side Right    BASELINE SCORE (UNILATERAL) -8.2              The patient was assessed using the L-Dex machine today to produce a lymphedema index baseline score. The patient will be reassessed on a regular basis (typically every 3 months) to obtain new L-Dex scores. If the score is > 6.5 points away from his/her baseline score indicating onset of subclinical lymphedema, it will be recommended to wear a compression garment for 4 weeks, 12 hours per day and then be reassessed. If the score continues to be > 6.5 points from baseline at reassessment, we will initiate lymphedema treatment. Assessing in this manner has a 95% rate of preventing clinically significant lymphedema.     Katina Dung - 04/02/21 0001     Open a tight or new jar Mild difficulty    Do heavy household chores (wash walls, wash floors) No  difficulty    Carry a shopping bag or briefcase No difficulty    Wash your back No difficulty    Use a knife  to cut food No difficulty    Recreational activities in which you take some force or impact through your arm, shoulder, or hand (golf, hammering, tennis) No difficulty    During the past week, to what extent has your arm, shoulder or hand problem interfered with your normal social activities with family, friends, neighbors, or groups? Slightly    During the past week, to what extent has your arm, shoulder or hand problem limited your work or other regular daily activities Not at all    Arm, shoulder, or hand pain. Mild    Tingling (pins and needles) in your arm, shoulder, or hand None    Difficulty Sleeping No difficulty    DASH Score 6.82 %              Objective measurements completed on examination: See above findings.                PT Education - 04/02/21 1634     Education Details ABC class, post op shoulder ROM exercises, anatomy and physiology of lymphatic system, signs and symptoms of lymphedema, SOZO, lymphedema risk reduction practices    Person(s) Educated Patient    Methods Explanation;Handout;Demonstration    Comprehension Verbalized understanding                 PT Long Term Goals - 04/02/21 1638       PT LONG TERM GOAL #1   Title Pt will return to baseline shoulder ROM and not demonstrate any signs or symptoms of lymphedema.    Time 4    Period Weeks    Status New    Target Date 04/30/21             Breast Clinic Goals - 04/02/21 1637       Patient will be able to verbalize understanding of pertinent lymphedema risk reduction practices relevant to her diagnosis specifically related to skin care.   Time 1    Period Days    Status Achieved      Patient will be able to return demonstrate and/or verbalize understanding of the post-op home exercise program related to regaining shoulder range of motion.   Time 1    Period Days    Status Achieved      Patient will be able to verbalize understanding of the importance of attending the  postoperative After Breast Cancer Class for further lymphedema risk reduction education and therapeutic exercise.   Time 1    Period Days    Status Achieved                   Plan - 04/02/21 1634     Clinical Impression Statement Pt was diagnosed with R breast cancer, grade 2-3 invasive ductal carcinoma. It is HER2-, ER+, PR+. She will be having a lumpectomy and SLNB on 04/06/21 and then will undergo radiation. Pt was instructed in post op exercises and educated that if she has a mastectomy then not to begin exercises until a week after the last drain is removed. Baseline ROM and SOZO measurements taken today. She will benefit from a post op PT reassessment to determine needs and in every 3 months for additional L dex screening to detect subclinical lymphedema.    Stability/Clinical Decision Making Stable/Uncomplicated    Clinical Decision Making Low  Rehab Potential Good    PT Frequency --   eval and 1 follow up visit   PT Duration 4 weeks    PT Treatment/Interventions ADLs/Self Care Home Management;Patient/family education;Therapeutic exercise    PT Next Visit Plan reassess baselines    PT Home Exercise Plan post op breast exercises    Consulted and Agree with Plan of Care Patient             Patient will benefit from skilled therapeutic intervention in order to improve the following deficits and impairments:  Postural dysfunction, Decreased knowledge of precautions  Visit Diagnosis: Abnormal posture  Malignant neoplasm of upper-outer quadrant of right breast in female, estrogen receptor positive ([place])   Patient will follow up at outpatient cancer rehab 3-4 weeks following surgery.  If the patient requires physical therapy at that time, a specific plan will be dictated and sent to the referring physician for approval. The patient was educated today on appropriate basic range of motion exercises to begin post operatively and the importance of attending the After Breast  Cancer class following surgery.  Patient was educated today on lymphedema risk reduction practices as it pertains to recommendations that will benefit the patient immediately following surgery.  She verbalized good understanding.     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

## 2021-04-02 NOTE — Progress Notes (Signed)

## 2021-04-05 ENCOUNTER — Ambulatory Visit
Admission: RE | Admit: 2021-04-05 | Discharge: 2021-04-05 | Disposition: A | Discharge status: Home / Self Care | Payer: Medicare HMO | Source: Ambulatory Visit | Attending: General Surgery | Admitting: General Surgery

## 2021-04-05 DIAGNOSIS — C50411 Malignant neoplasm of upper-outer quadrant of right female breast: Secondary | ICD-10-CM

## 2021-04-05 DIAGNOSIS — R928 Other abnormal and inconclusive findings on diagnostic imaging of breast: Secondary | ICD-10-CM | POA: Diagnosis not present

## 2021-04-05 DIAGNOSIS — Z170 Estrogen receptor positive status [ER+]: Secondary | ICD-10-CM

## 2021-04-06 ENCOUNTER — Ambulatory Visit
Admission: RE | Admit: 2021-04-06 | Discharge: 2021-04-06 | Disposition: A | Discharge status: Home / Self Care | Payer: Medicare HMO | Source: Ambulatory Visit | Attending: General Surgery | Admitting: General Surgery

## 2021-04-06 ENCOUNTER — Ambulatory Visit (HOSPITAL_BASED_OUTPATIENT_CLINIC_OR_DEPARTMENT_OTHER)
Admission: RE | Admit: 2021-04-06 | Discharge: 2021-04-06 | Disposition: A | Discharge status: Home / Self Care | Payer: Medicare HMO | Attending: General Surgery | Admitting: General Surgery

## 2021-04-06 ENCOUNTER — Other Ambulatory Visit: Payer: Self-pay

## 2021-04-06 ENCOUNTER — Encounter (HOSPITAL_BASED_OUTPATIENT_CLINIC_OR_DEPARTMENT_OTHER)
Admission: RE | Disposition: A | Discharge status: Home / Self Care | Payer: Self-pay | Source: Home / Self Care | Attending: General Surgery

## 2021-04-06 ENCOUNTER — Encounter (HOSPITAL_BASED_OUTPATIENT_CLINIC_OR_DEPARTMENT_OTHER): Payer: Self-pay | Admitting: General Surgery

## 2021-04-06 ENCOUNTER — Ambulatory Visit (HOSPITAL_BASED_OUTPATIENT_CLINIC_OR_DEPARTMENT_OTHER): Payer: Medicare HMO | Admitting: Anesthesiology

## 2021-04-06 DIAGNOSIS — Z87891 Personal history of nicotine dependence: Secondary | ICD-10-CM | POA: Diagnosis not present

## 2021-04-06 DIAGNOSIS — G8918 Other acute postprocedural pain: Secondary | ICD-10-CM | POA: Diagnosis not present

## 2021-04-06 DIAGNOSIS — C50411 Malignant neoplasm of upper-outer quadrant of right female breast: Secondary | ICD-10-CM

## 2021-04-06 DIAGNOSIS — E039 Hypothyroidism, unspecified: Secondary | ICD-10-CM | POA: Insufficient documentation

## 2021-04-06 DIAGNOSIS — Z170 Estrogen receptor positive status [ER+]: Secondary | ICD-10-CM | POA: Diagnosis not present

## 2021-04-06 DIAGNOSIS — F32A Depression, unspecified: Secondary | ICD-10-CM | POA: Diagnosis not present

## 2021-04-06 DIAGNOSIS — I10 Essential (primary) hypertension: Secondary | ICD-10-CM | POA: Diagnosis not present

## 2021-04-06 DIAGNOSIS — C50911 Malignant neoplasm of unspecified site of right female breast: Secondary | ICD-10-CM | POA: Diagnosis not present

## 2021-04-06 DIAGNOSIS — R928 Other abnormal and inconclusive findings on diagnostic imaging of breast: Secondary | ICD-10-CM | POA: Diagnosis not present

## 2021-04-06 DIAGNOSIS — K449 Diaphragmatic hernia without obstruction or gangrene: Secondary | ICD-10-CM | POA: Insufficient documentation

## 2021-04-06 HISTORY — PX: BREAST LUMPECTOMY WITH RADIOACTIVE SEED AND SENTINEL LYMPH NODE BIOPSY: SHX6550

## 2021-04-06 SURGERY — BREAST LUMPECTOMY WITH RADIOACTIVE SEED AND SENTINEL LYMPH NODE BIOPSY
Anesthesia: General | Site: Breast | Laterality: Right

## 2021-04-06 MED ORDER — CHLORHEXIDINE GLUCONATE CLOTH 2 % EX PADS: 6.0000 | MEDICATED_PAD | Freq: Once | CUTANEOUS | Status: DC

## 2021-04-06 MED ORDER — CEFAZOLIN SODIUM-DEXTROSE 2-4 GM/100ML-% IV SOLN
2.0000 g | INTRAVENOUS | Status: AC
  Administered 2021-04-06: 2 g via INTRAVENOUS

## 2021-04-06 MED ORDER — BUPIVACAINE-EPINEPHRINE (PF) 0.5% -1:200000 IJ SOLN
INTRAMUSCULAR | Status: DC | PRN
  Administered 2021-04-06: 30 mL

## 2021-04-06 MED ORDER — OXYCODONE HCL 5 MG/5ML PO SOLN: 5.0000 mg | Freq: Once | ORAL | Status: DC | PRN

## 2021-04-06 MED ORDER — ACETAMINOPHEN 500 MG PO TABS
1000.0000 mg | ORAL_TABLET | ORAL | Status: AC
  Administered 2021-04-06: 1000 mg via ORAL

## 2021-04-06 MED ORDER — GABAPENTIN 300 MG PO CAPS
300.0000 mg | ORAL_CAPSULE | ORAL | Status: AC
  Administered 2021-04-06: 300 mg via ORAL

## 2021-04-06 MED ORDER — MIDAZOLAM HCL 2 MG/2ML IJ SOLN
INTRAMUSCULAR | Status: AC
  Filled 2021-04-06: qty 2

## 2021-04-06 MED ORDER — ACETAMINOPHEN 160 MG/5ML PO SOLN: 325.0000 mg | ORAL | Status: DC | PRN

## 2021-04-06 MED ORDER — SCOPOLAMINE 1 MG/3DAYS TD PT72
1.0000 | MEDICATED_PATCH | TRANSDERMAL | Status: DC
  Administered 2021-04-06: 1.5 mg via TRANSDERMAL

## 2021-04-06 MED ORDER — FENTANYL CITRATE (PF) 100 MCG/2ML IJ SOLN
50.0000 ug | Freq: Once | INTRAMUSCULAR | Status: AC
  Administered 2021-04-06: 50 ug via INTRAVENOUS

## 2021-04-06 MED ORDER — FENTANYL CITRATE (PF) 100 MCG/2ML IJ SOLN: 25.0000 ug | INTRAMUSCULAR | Status: DC | PRN

## 2021-04-06 MED ORDER — CELECOXIB 200 MG PO CAPS
ORAL_CAPSULE | ORAL | Status: AC
  Filled 2021-04-06: qty 1

## 2021-04-06 MED ORDER — FENTANYL CITRATE (PF) 100 MCG/2ML IJ SOLN
INTRAMUSCULAR | Status: DC | PRN
Start: 2021-04-06 — End: 2021-04-06
  Administered 2021-04-06: 2 ug via INTRAVENOUS

## 2021-04-06 MED ORDER — BUPIVACAINE-EPINEPHRINE 0.25% -1:200000 IJ SOLN
INTRAMUSCULAR | Status: DC | PRN
  Administered 2021-04-06: 14 mL

## 2021-04-06 MED ORDER — LACTATED RINGERS IV SOLN: INTRAVENOUS | Status: DC

## 2021-04-06 MED ORDER — ONDANSETRON HCL 4 MG/2ML IJ SOLN
INTRAMUSCULAR | Status: DC | PRN
  Administered 2021-04-06: 4 mg via INTRAVENOUS

## 2021-04-06 MED ORDER — PROPOFOL 10 MG/ML IV BOLUS
INTRAVENOUS | Status: DC | PRN
Start: 2021-04-06 — End: 2021-04-06
  Administered 2021-04-06: 150 mg via INTRAVENOUS

## 2021-04-06 MED ORDER — SCOPOLAMINE 1 MG/3DAYS TD PT72
MEDICATED_PATCH | TRANSDERMAL | Status: AC
  Filled 2021-04-06: qty 1

## 2021-04-06 MED ORDER — ACETAMINOPHEN 500 MG PO TABS
ORAL_TABLET | ORAL | Status: AC
  Filled 2021-04-06: qty 2

## 2021-04-06 MED ORDER — PROMETHAZINE HCL 25 MG/ML IJ SOLN: 6.2500 mg | INTRAMUSCULAR | Status: DC | PRN

## 2021-04-06 MED ORDER — GABAPENTIN 300 MG PO CAPS
ORAL_CAPSULE | ORAL | Status: AC
  Filled 2021-04-06: qty 1

## 2021-04-06 MED ORDER — FENTANYL CITRATE (PF) 100 MCG/2ML IJ SOLN
INTRAMUSCULAR | Status: AC
  Filled 2021-04-06: qty 2

## 2021-04-06 MED ORDER — 0.9 % SODIUM CHLORIDE (POUR BTL) OPTIME
TOPICAL | Status: DC | PRN
Start: 2021-04-06 — End: 2021-04-06
  Administered 2021-04-06: 120 mL

## 2021-04-06 MED ORDER — CEFAZOLIN SODIUM-DEXTROSE 2-4 GM/100ML-% IV SOLN
INTRAVENOUS | Status: AC
  Filled 2021-04-06: qty 100

## 2021-04-06 MED ORDER — ACETAMINOPHEN 325 MG PO TABS: 325.0000 mg | ORAL_TABLET | ORAL | Status: DC | PRN

## 2021-04-06 MED ORDER — CELECOXIB 200 MG PO CAPS
200.0000 mg | ORAL_CAPSULE | ORAL | Status: AC
Start: 2021-04-06 — End: 2021-04-06
  Administered 2021-04-06: 200 mg via ORAL

## 2021-04-06 MED ORDER — MAGTRACE LYMPHATIC TRACER
INTRAMUSCULAR | Status: DC | PRN
  Administered 2021-04-06: 2 mL via INTRAMUSCULAR

## 2021-04-06 MED ORDER — ACETAMINOPHEN 10 MG/ML IV SOLN: 1000.0000 mg | Freq: Once | INTRAVENOUS | Status: DC | PRN

## 2021-04-06 MED ORDER — MIDAZOLAM HCL 2 MG/2ML IJ SOLN
2.0000 mg | Freq: Once | INTRAMUSCULAR | Status: AC
  Administered 2021-04-06: 2 mg via INTRAVENOUS

## 2021-04-06 MED ORDER — AMISULPRIDE (ANTIEMETIC) 5 MG/2ML IV SOLN: 10.0000 mg | Freq: Once | INTRAVENOUS | Status: DC | PRN

## 2021-04-06 MED ORDER — BUPIVACAINE-EPINEPHRINE (PF) 0.25% -1:200000 IJ SOLN
INTRAMUSCULAR | Status: AC
  Filled 2021-04-06: qty 30

## 2021-04-06 MED ORDER — LIDOCAINE 2% (20 MG/ML) 5 ML SYRINGE
INTRAMUSCULAR | Status: DC | PRN
  Administered 2021-04-06: 30 mg via INTRAVENOUS

## 2021-04-06 MED ORDER — PROPOFOL 500 MG/50ML IV EMUL
INTRAVENOUS | Status: DC | PRN
  Administered 2021-04-06: 25 ug/kg/min via INTRAVENOUS

## 2021-04-06 MED ORDER — OXYCODONE HCL 5 MG PO TABS: 5.0000 mg | ORAL_TABLET | Freq: Once | ORAL | Status: DC | PRN

## 2021-04-06 MED ORDER — DEXAMETHASONE SODIUM PHOSPHATE 4 MG/ML IJ SOLN
INTRAMUSCULAR | Status: DC | PRN
  Administered 2021-04-06: 4 mg via INTRAVENOUS

## 2021-04-06 MED ORDER — HYDROCODONE-ACETAMINOPHEN 5-325 MG PO TABS: 1.0000 | ORAL_TABLET | Freq: Four times a day (QID) | ORAL | 0 refills | Status: DC | PRN

## 2021-04-06 SURGICAL SUPPLY — 47 items
ADH SKN CLS APL DERMABOND .7 (GAUZE/BANDAGES/DRESSINGS) ×1
APL PRP STRL LF DISP 70% ISPRP (MISCELLANEOUS) ×1
APPLIER CLIP 9.375 MED OPEN (MISCELLANEOUS) ×4
APR CLP MED 9.3 20 MLT OPN (MISCELLANEOUS) ×2
BLADE SURG 15 STRL LF DISP TIS (BLADE) ×1 IMPLANT
BLADE SURG 15 STRL SS (BLADE) ×4
CANISTER SUC SOCK COL 7IN (MISCELLANEOUS) IMPLANT
CANISTER SUCT 1200ML W/VALVE (MISCELLANEOUS) ×1 IMPLANT
CHLORAPREP W/TINT 26 (MISCELLANEOUS) ×2 IMPLANT
CLIP APPLIE 9.375 MED OPEN (MISCELLANEOUS) ×1 IMPLANT
COVER BACK TABLE 60X90IN (DRAPES) ×2 IMPLANT
COVER MAYO STAND STRL (DRAPES) ×2 IMPLANT
COVER PROBE W GEL 5X96 (DRAPES) ×2 IMPLANT
DERMABOND ADVANCED (GAUZE/BANDAGES/DRESSINGS) ×1
DERMABOND ADVANCED .7 DNX12 (GAUZE/BANDAGES/DRESSINGS) ×1 IMPLANT
DRAPE LAPAROSCOPIC ABDOMINAL (DRAPES) ×2 IMPLANT
DRAPE UTILITY XL STRL (DRAPES) ×2 IMPLANT
ELECT COATED BLADE 2.86 ST (ELECTRODE) ×2 IMPLANT
ELECT REM PT RETURN 9FT ADLT (ELECTROSURGICAL) ×2
ELECTRODE REM PT RTRN 9FT ADLT (ELECTROSURGICAL) ×1 IMPLANT
GLOVE SURG ENC MOIS LTX SZ7.5 (GLOVE) ×2 IMPLANT
GLOVE SURG POLYISO LF SZ7 (GLOVE) ×2 IMPLANT
GLOVE SURG UNDER POLY LF SZ7 (GLOVE) ×2 IMPLANT
GOWN STRL REUS W/ TWL LRG LVL3 (GOWN DISPOSABLE) ×2 IMPLANT
GOWN STRL REUS W/TWL LRG LVL3 (GOWN DISPOSABLE) ×4
ILLUMINATOR WAVEGUIDE N/F (MISCELLANEOUS) IMPLANT
KIT MARKER MARGIN INK (KITS) ×2 IMPLANT
LIGHT WAVEGUIDE WIDE FLAT (MISCELLANEOUS) IMPLANT
NDL HYPO 25X1 1.5 SAFETY (NEEDLE) ×1 IMPLANT
NDL SAFETY ECLIPSE 18X1.5 (NEEDLE) IMPLANT
NEEDLE HYPO 18GX1.5 SHARP (NEEDLE)
NEEDLE HYPO 25X1 1.5 SAFETY (NEEDLE) ×2 IMPLANT
NS IRRIG 1000ML POUR BTL (IV SOLUTION) ×1 IMPLANT
PACK BASIN DAY SURGERY FS (CUSTOM PROCEDURE TRAY) ×2 IMPLANT
PENCIL SMOKE EVACUATOR (MISCELLANEOUS) ×2 IMPLANT
SLEEVE SCD COMPRESS KNEE MED (STOCKING) ×2 IMPLANT
SPIKE FLUID TRANSFER (MISCELLANEOUS) IMPLANT
SPONGE T-LAP 18X18 ~~LOC~~+RFID (SPONGE) ×2 IMPLANT
SUT MON AB 4-0 PC3 18 (SUTURE) ×4 IMPLANT
SUT SILK 2 0 SH (SUTURE) IMPLANT
SUT VICRYL 3-0 CR8 SH (SUTURE) ×2 IMPLANT
SYR CONTROL 10ML LL (SYRINGE) ×2 IMPLANT
TOWEL GREEN STERILE FF (TOWEL DISPOSABLE) ×2 IMPLANT
TRACER MAGTRACE VIAL (MISCELLANEOUS) ×1 IMPLANT
TRAY FAXITRON CT DISP (TRAY / TRAY PROCEDURE) ×2 IMPLANT
TUBE CONNECTING 20X1/4 (TUBING) ×1 IMPLANT
YANKAUER SUCT BULB TIP NO VENT (SUCTIONS) ×1 IMPLANT

## 2021-04-06 NOTE — Transfer of Care (Signed)
Immediate Anesthesia Transfer of Care Note  Patient: Pantera A Inabinet  Procedure(s) Performed: RIGHT BREAST LUMPECTOMY WITH RADIOACTIVE SEED AND SENTINEL LYMPH NODE BIOPSY (Right: Breast)  Patient Location: PACU  Anesthesia Type:GA combined with regional for post-op pain  Level of Consciousness: drowsy and patient cooperative  Airway & Oxygen Therapy: Patient Spontanous Breathing and Patient connected to face mask oxygen  Post-op Assessment: Report given to RN and Post -op Vital signs reviewed and stable  Post vital signs: Reviewed and stable  Last Vitals:  Vitals Value Taken Time  BP    Temp    Pulse 71 04/06/21 1310  Resp 16 04/06/21 1310  SpO2 100 % 04/06/21 1310  Vitals shown include unvalidated device data.  Last Pain:  Vitals:   04/06/21 0918  TempSrc: Oral  PainSc: 0-No pain         Complications: No notable events documented.

## 2021-04-06 NOTE — Interval H&P Note (Signed)
History and Physical Interval Note:  04/06/2021 11:16 AM  Jordan Romero  has presented today for surgery, with the diagnosis of RIGHT BREAST CANCER.  The various methods of treatment have been discussed with the patient and family. After consideration of risks, benefits and other options for treatment, the patient has consented to  Procedure(s): RIGHT BREAST LUMPECTOMY WITH RADIOACTIVE SEED AND SENTINEL LYMPH NODE BIOPSY (Right) as a surgical intervention.  The patient's history has been reviewed, patient examined, no change in status, stable for surgery.  I have reviewed the patient's chart and labs.  Questions were answered to the patient's satisfaction.     [name]

## 2021-04-06 NOTE — Op Note (Signed)
04/06/2021  1:00 PM  PATIENT:  Jordan Romero  71 y.o. female  PRE-OPERATIVE DIAGNOSIS:  RIGHT BREAST CANCER  POST-OPERATIVE DIAGNOSIS:  RIGHT BREAST CANCER  PROCEDURE:  Procedure(s): RIGHT BREAST LUMPECTOMY WITH RADIOACTIVE SEED LOCALIZATION AND DEEP RIGHT AXILLARY SENTINEL LYMPH NODE BIOPSY (Right)  SURGEON:  Surgeon(s) and Role:    * Jovita Kussmaul, MD - Primary  PHYSICIAN ASSISTANT:   ASSISTANTS: none   ANESTHESIA:   local and general  EBL:  Minimal   BLOOD ADMINISTERED:none  DRAINS: none   LOCAL MEDICATIONS USED:  MARCAINE     SPECIMEN:  Source of Specimen:  right breast tissue with additional inferior margin and sentinel node x 2  DISPOSITION OF SPECIMEN:  PATHOLOGY  COUNTS:  YES  TOURNIQUET:  * No tourniquets in log *  DICTATION: .Dragon Dictation  After informed consent was obtained the patient was brought to the operating room and placed in the supine position on the operating table.  After adequate induction of general anesthesia the patient's right chest, breast, and axillary area were prepped with ChloraPrep, allowed to dry, draped in usual sterile manner.  An appropriate timeout was performed.  At this point, 2 cc of iron oxide were injected in the subareolar plexus of the right breast and the breast was massaged for 5 minutes.  Previously an I-125 seed was placed in the upper portion of the right breast to mark an area of invasive breast cancer.  Attention was first turned to the right axilla.  Mag trace was used to identify a signal in the right axilla.  This area was infiltrated with quarter percent Marcaine.  A small transversely oriented incision was made with a 15 blade knife overlying the area of activity.  The incision was carried through the skin and subcutaneous tissue sharply with the electrocautery until the deep right axillary space was entered.  Using the mag trace I was able to identify 2 hot lymph nodes.  These were excised sharply with the  electrocautery and the surrounding small vessels and lymphatics were controlled with clips.  These were sent as sentinel nodes numbers 1 and 2.  No other hot or palpable nodes were identified in the right axilla.  Hemostasis was achieved using the Bovie electrocautery.  The deep layer of the incision was closed with interrupted 3-0 Vicryl stitches.  The skin was closed with a running 4-0 Monocryl subcuticular stitch.  Attention was then turned to the right breast.  The neoprobe was set to I-125 in the area of radioactivity was readily identified.  An elliptical incision was made in the skin overlying the area of radioactivity with a 15 blade knife.  The incision was carried through the skin and subcutaneous tissue sharply with the electrocautery.  Dissection was then carried widely around the radioactive seed while checking the area of radioactivity frequently.  Once the specimen was removed it was oriented with the appropriate painkillers.  A specimen radiograph was obtained that showed the clip and seed to be near the center of the specimen.  The specimen was then sent to pathology for further evaluation.  I did elect to take an additional inferior margin and this was marked appropriately and also sent to pathology.  The cavity was marked with clips.  The wound was irrigated with saline and infiltrated with quarter percent Marcaine.  The deep layer of the wound was then closed with layers of interrupted 3-0 Vicryl stitches.  The skin was then closed with a running 4 Monocryl  subcuticular stitch.  Dermabond dressings were applied.  The patient tolerated the procedure well.  At the end of the case all needle sponge and instrument counts were correct.  The patient was then awakened and taken to recovery in stable condition.  PLAN OF CARE: Discharge to home after PACU  PATIENT DISPOSITION:  PACU - hemodynamically stable.   Delay start of Pharmacological VTE agent (>24hrs) due to surgical blood loss or risk of  bleeding: not applicable

## 2021-04-06 NOTE — Anesthesia Postprocedure Evaluation (Signed)
Anesthesia Post Note  Patient: Jordan Romero  Procedure(s) Performed: RIGHT BREAST LUMPECTOMY WITH RADIOACTIVE SEED AND SENTINEL LYMPH NODE BIOPSY (Right: Breast)     Patient location during evaluation: PACU Anesthesia Type: General Level of consciousness: awake and alert Pain management: pain level controlled Vital Signs Assessment: post-procedure vital signs reviewed and stable Respiratory status: spontaneous breathing, nonlabored ventilation, respiratory function stable and patient connected to nasal cannula oxygen Cardiovascular status: blood pressure returned to baseline and stable Postop Assessment: no apparent nausea or vomiting Anesthetic complications: no   No notable events documented.  Last Vitals:  Vitals:   04/06/21 1345 04/06/21 1400  BP: 136/82 (!) 148/87  Pulse: 65 (!) 55  Resp: (!) 9 16  Temp:  36.5 C  SpO2: 97% 97%    Last Pain:  Vitals:   04/06/21 1400  TempSrc:   PainSc: 0-No pain                 Effie Berkshire

## 2021-04-06 NOTE — Progress Notes (Signed)
Assisted Dr. Smith Robert with right, ultrasound guided, pectoralis block. Side rails up, monitors on throughout procedure. See vital signs in flow sheet. Tolerated Procedure well.

## 2021-04-06 NOTE — Anesthesia Preprocedure Evaluation (Addendum)
Anesthesia Evaluation  Patient identified by MRN, date of birth, ID band Patient awake    Reviewed: Allergy & Precautions, NPO status , Patient's Chart, lab work & pertinent test results  Airway Mallampati: II  TM Distance: >3 FB Neck ROM: Full    Dental  (+) Partial Lower, Partial Upper   Pulmonary former smoker,    breath sounds clear to auscultation       Cardiovascular hypertension, Pt. on medications  Rhythm:Regular Rate:Normal     Neuro/Psych PSYCHIATRIC DISORDERS Depression    GI/Hepatic Neg liver ROS, hiatal hernia,   Endo/Other  Hypothyroidism   Renal/GU negative Renal ROS     Musculoskeletal negative musculoskeletal ROS (+)   Abdominal Normal abdominal exam  (+)   Peds  Hematology negative hematology ROS (+)   Anesthesia Other Findings   Reproductive/Obstetrics                            Anesthesia Physical Anesthesia Plan  ASA: 2  Anesthesia Plan: General   Post-op Pain Management: Regional block   Induction: Intravenous  PONV Risk Score and Plan: 4 or greater and Ondansetron, Dexamethasone, Midazolam and Scopolamine patch - Pre-op  Airway Management Planned: LMA  Additional Equipment: None  Intra-op Plan:   Post-operative Plan: Extubation in OR  Informed Consent: I have reviewed the patients History and Physical, chart, labs and discussed the procedure including the risks, benefits and alternatives for the proposed anesthesia with the patient or authorized representative who has indicated his/her understanding and acceptance.     Dental advisory given  Plan Discussed with: CRNA  Anesthesia Plan Comments:        Anesthesia Quick Evaluation

## 2021-04-06 NOTE — H&P (Signed)
°REFERRING PHYSICIAN: Morrow, Aaron S, MD ° °PROVIDER:  STEPHEN TOTH, MD ° °MRN: [id number] °DOB: 07/05/1950 °Subjective  ° °Chief Complaint: Breast Cancer ° ° °History of Present Illness: °Jordan Romero is a 71 y.o. female who is seen today as an office consultation at the request of Dr. Morrow for evaluation of Breast Cancer °.  ° °We are asked to see the patient in consultation by Dr. Morrow to evaluate her for a new right breast cancer. The patient is a 71-year-old white female female who recently went for a routine screening mammogram. At that time she was found to have a 3 mm mass in the upper aspect of the right breast. The lymph nodes all looked normal. 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 The mass measured 3 mm in diameter. She does have some family history of colon cancer in her mother and father, possible leukemia in a sister, and an unknown cancer in her paternal grandmother. She did quit smoking about 7 years ago ° °Review of Systems: °A complete review of systems was obtained from the patient. I have reviewed this information and discussed as appropriate with the patient. See HPI as well for other ROS. ° °ROS  ° °Medical History: °Past Medical History:  °Diagnosis Date  ° Glaucoma (increased eye pressure)  ° Thyroid disease  ° °Patient Active Problem List  °Diagnosis  ° Malignant neoplasm of upper-outer quadrant of right breast in female, estrogen receptor positive (CMS-HCC)  ° °Past Surgical History:  °Procedure Laterality Date  ° HYSTERECTOMY  ° ° °No Known Allergies ° °Current Outpatient Medications on File Prior to Visit  °Medication Sig Dispense Refill  ° azaTHIOprine (IMURAN) 50 mg tablet azathioprine 50 mg tablet  ° donepeziL (ARICEPT) 10 MG tablet donepezil 10 mg tablet  ° dorzolamide-timoloL (COSOPT) 22.3-6.8 mg/mL ophthalmic solution dorzolamide 22.3 mg-timolol 6.8 mg/mL eye drops  °  DULoxetine (CYMBALTA) 60 MG DR capsule duloxetine 60 mg capsule,delayed release  ° levothyroxine (SYNTHROID) 75 MCG tablet TAKE 1 TABLET ONE TIME DAILY IN THE MORNING ON AN EMPTY STOMACH  ° lisinopriL (ZESTRIL) 10 MG tablet lisinopril 10 mg tablet  ° °No current facility-administered medications on file prior to visit.  ° °Family History  °Problem Relation Age of Onset  ° Colon cancer Father  ° ° °Social History  ° °Tobacco Use  °Smoking Status Former  ° Types: Cigarettes  ° Quit date: 2015  ° Years since quitting: 8.0  °Smokeless Tobacco Never  ° ° °Social History  ° °Socioeconomic History  ° Marital status: Married  °Tobacco Use  ° Smoking status: Former  °Types: Cigarettes  °Quit date: 2015  °Years since quitting: 8.0  ° Smokeless tobacco: Never  °Substance and Sexual Activity  ° Alcohol use: Never  ° Drug use: Never  ° °Objective:  ° °Vitals:  °BP: 136/86  °Pulse: 68  °Weight: (!) 101.6 kg (224 lb)  °Height: 165.1 cm (5' 5")  ° °Body mass index is 37.28 kg/m². ° °Physical Exam °Vitals reviewed.  °Constitutional:  °General: She is not in acute distress. °Appearance: Normal appearance.  °HENT:  °Head: Normocephalic and atraumatic.  °Right Ear: External ear normal.  °Left Ear: External ear normal.  °Nose: Nose normal.  °Mouth/Throat:  °Mouth: Mucous membranes are moist.  °Pharynx: Oropharynx is clear.  °Eyes:  °General: No scleral icterus. °Extraocular Movements: Extraocular movements intact.  °Conjunctiva/sclera: Conjunctivae normal.  °  normal.  Pupils: Pupils are equal, round, and reactive to light.  Cardiovascular:  Rate and Rhythm: Normal rate and regular rhythm.  Pulses: Normal pulses.  Heart sounds: Normal heart sounds.  Pulmonary:  Effort: Pulmonary effort is normal. No respiratory distress.  Breath sounds: Normal breath sounds.  Abdominal:  General: Bowel sounds are normal.  Palpations: Abdomen is soft.  Tenderness: There is no abdominal tenderness.  Musculoskeletal:  General: No swelling, tenderness or  deformity. Normal range of motion.  Cervical back: Normal range of motion and neck supple.  Skin: General: Skin is warm and dry.  Coloration: Skin is not jaundiced.  Neurological:  General: No focal deficit present.  Mental Status: She is alert and oriented to person, place, and time.  Psychiatric:  Mood and Affect: Mood normal.  Behavior: Behavior normal.     Breast: There is no palpable mass in either breast. There is no palpable axillary, supraclavicular, or cervical lymphadenopathy.  Labs, Imaging and Diagnostic Testing:  Assessment and Plan:   Diagnoses and all orders for this visit:  Malignant neoplasm of upper-outer quadrant of right breast in female, estrogen receptor positive (CMS-HCC) - Ambulatory Referral to Oncology-Medical - Ambulatory Referral to Genetics - Ambulatory Referral to Physical Therapy - Ambulatory Referral to Radiation Oncology - [facility] Case Posting Request; Future    The patient appears to have a very small stage I cancer in the upper portion of the right breast with all favorable markers. I have discussed with her in detail the different options for treatment and at this point she favors breast conservation which I feel is very reasonable. She is also a good candidate for sentinel node biopsy. I have discussed with her in detail the risks and benefits of the operation as well as some of the technical aspects including the use of a radioactive seed for localization and she understands and wishes to proceed. We will refer her to medical and radiation oncology to discuss adjuvant therapy as well as possible genetics. I will also refer her to physical therapy for preoperative lymphedema testing.

## 2021-04-06 NOTE — Discharge Instructions (Signed)
°  Post Anesthesia Home Care Instructions  Activity: Get plenty of rest for the remainder of the day. A responsible individual must stay with you for 24 hours following the procedure.  For the next 24 hours, DO NOT: -Drive a car -Paediatric nurse -Drink alcoholic beverages -Take any medication unless instructed by your physician -Make any legal decisions or sign important papers.  No Tylenol or ibuprofen until after 3:20pm today if needed  Meals: Start with liquid foods such as gelatin or soup. Progress to regular foods as tolerated. Avoid greasy, spicy, heavy foods. If nausea and/or vomiting occur, drink only clear liquids until the nausea and/or vomiting subsides. Call your physician if vomiting continues.  Special Instructions/Symptoms: Your throat may feel dry or sore from the anesthesia or the breathing tube placed in your throat during surgery. If this causes discomfort, gargle with warm salt water. The discomfort should disappear within 24 hours.  If you had a scopolamine patch placed behind your ear for the management of post- operative nausea and/or vomiting:  1. The medication in the patch is effective for 72 hours, after which it should be removed.  Wrap patch in a tissue and discard in the trash. Wash hands thoroughly with soap and water. 2. You may remove the patch earlier than 72 hours if you experience unpleasant side effects which may include dry mouth, dizziness or visual disturbances. 3. Avoid touching the patch. Wash your hands with soap and water after contact with the patch.

## 2021-04-06 NOTE — Anesthesia Procedure Notes (Signed)
Anesthesia Regional Block: Pectoralis block   Pre-Anesthetic Checklist: , timeout performed,  Correct Patient, Correct Site, Correct Laterality,  Correct Procedure, Correct Position, site marked,  Risks and benefits discussed,  Surgical consent,  Pre-op evaluation,  At surgeon's request and post-op pain management  Laterality: Right  Prep: chloraprep       Needles:  Injection technique: Single-shot  Needle Type: Echogenic Stimulator Needle     Needle Length: 9cm  Needle Gauge: 21     Additional Needles:   Procedures:,,,, ultrasound used (permanent image in chart),,    Narrative:  Start time: 04/06/2021 9:40 AM End time: 04/06/2021 9:45 AM Injection made incrementally with aspirations every 5 mL.  Performed by: Personally  Anesthesiologist: Effie Berkshire, MD  Additional Notes: Patient tolerated the procedure well. Local anesthetic introduced in an incremental fashion under minimal resistance after negative aspirations. No paresthesias were elicited. After completion of the procedure, no acute issues were identified and patient continued to be monitored by RN.

## 2021-04-06 NOTE — Anesthesia Procedure Notes (Signed)
Procedure Name: LMA Insertion Date/Time: 04/06/2021 11:50 AM Performed by: Signe Colt, CRNA Pre-anesthesia Checklist: Patient identified, Emergency Drugs available, Suction available and Patient being monitored Patient Re-evaluated:Patient Re-evaluated prior to induction Oxygen Delivery Method: Circle System Utilized Preoxygenation: Pre-oxygenation with 100% oxygen Induction Type: IV induction Ventilation: Mask ventilation without difficulty LMA: LMA inserted LMA Size: 4.0 Number of attempts: 1 Airway Equipment and Method: bite block Placement Confirmation: positive ETCO2 Tube secured with: Tape Dental Injury: Teeth and Oropharynx as per pre-operative assessment

## 2021-04-09 ENCOUNTER — Encounter (HOSPITAL_BASED_OUTPATIENT_CLINIC_OR_DEPARTMENT_OTHER): Payer: Self-pay | Admitting: General Surgery

## 2021-04-09 LAB — SURGICAL PATHOLOGY

## 2021-04-12 ENCOUNTER — Encounter: Payer: Self-pay | Admitting: *Deleted

## 2021-04-12 DIAGNOSIS — C50411 Malignant neoplasm of upper-outer quadrant of right female breast: Secondary | ICD-10-CM

## 2021-04-12 NOTE — Progress Notes (Signed)
HEMATOLOGY-ONCOLOGY TELEPHONE VISIT PROGRESS NOTE  I connected with Jordan Romero on 04/13/2021 at 11:45 AM EST by telephone and verified that I am speaking with the correct person using two identifiers.  I discussed the limitations, risks, security and privacy concerns of performing an evaluation and management service by telephone and the availability of in person appointments.  I also discussed with the patient that there may be a patient responsible charge related to this service. The patient expressed understanding and agreed to proceed.   History of Present Illness: Jordan Romero is a 71 y.o. female with above-mentioned history of right breast cancer. Right lumpectomy on 04/06/2021 showed grade 2 invasive ductal carcinoma with lymph nodes negative for carcinoma. She presents to the clinic today for follow-up.  She is doing very well from surgery standpoint.  Healing and recovering very well.  Oncology History  Malignant neoplasm of upper-outer quadrant of right breast in female, estrogen receptor positive ([place])  02/27/2021 Initial Diagnosis   Screening mammogram: a possible mass in the right breast. Diagnostic mammogram: an indeterminate 2 mm mass in the right breast at 12 o'clock. 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  Ki-67 30%   03/27/2021 Cancer Staging   Staging form: Breast, AJCC 8th Edition - Clinical stage from 03/27/2021: Stage IA (cT1a, cN0, cM0, G3, ER+, PR+, HER2-) - Signed by Nicholas Lose, MD on 03/29/2021 Histologic grading system: 3 grade system      Observations/Objective:    Assessment Plan:  Malignant neoplasm of upper-outer quadrant of right breast in female, estrogen receptor positive ([place]) 02/27/2021:Screening mammogram: a possible mass in the right breast. Diagnostic mammogram: an indeterminate 2 mm mass in the right breast at 12 o'clock. 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  Ki-67 30%      Recommendations: 1. Breast conserving surgery: Grade 2 IDC 2 mm, margins Neg, 0/2 LN neg, ER 100%, PR 10%, Her 2 Neg, Ki 67: 30$ 2. Adjuvant radiation therapy (she wants to discuss the pros and cons of radiation) 3. Adjuvant antiestrogen therapy with letrozole 2.5 mg daily x5 years    no role for Oncotype testing.  RTC after XRT If she does not get radiation then we can start her on antiestrogen therapy.   I discussed the assessment and treatment plan with the patient. The patient was provided an opportunity to ask questions and all were answered. The patient agreed with the plan and demonstrated an understanding of the instructions. The patient was advised to call back or seek an in-person evaluation if the symptoms worsen or if the condition fails to improve as anticipated.   Total time spent: 30 mins including non-face to face time and time spent for planning, charting and coordination of care  Rulon Eisenmenger, MD 04/13/2021    I, Thana Ates, am acting as scribe for Nicholas Lose, MD.  I have reviewed the above documentation for accuracy and completeness, and I agree with the above.

## 2021-04-12 NOTE — Assessment & Plan Note (Signed)
02/27/2021:Screening mammogram: a possible mass in the right breast. Diagnostic mammogram: an indeterminate 2 mm mass in the right breast at 12 o'clock. 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  Ki-67 30%     Recommendations: 1. Breast conserving surgery: Grade 2 IDC 2 mm, margins Neg, 0/2 LN neg, ER 100%, PR 10%, Her 2 Neg, Ki 67: 30$ 2. Adjuvant radiation therapy followed by 3. Adjuvant antiestrogen therapy   no role for Oncotype testing. RTC after XRT

## 2021-04-13 ENCOUNTER — Inpatient Hospital Stay: Payer: Medicare HMO | Admitting: Hematology and Oncology

## 2021-04-13 ENCOUNTER — Ambulatory Visit: Payer: Self-pay | Admitting: Genetic Counselor

## 2021-04-13 ENCOUNTER — Telehealth: Payer: Self-pay | Admitting: Genetic Counselor

## 2021-04-13 ENCOUNTER — Inpatient Hospital Stay: Payer: Medicare HMO | Attending: Hematology and Oncology | Admitting: Hematology and Oncology

## 2021-04-13 ENCOUNTER — Encounter: Payer: Self-pay | Admitting: Genetic Counselor

## 2021-04-13 DIAGNOSIS — Z170 Estrogen receptor positive status [ER+]: Secondary | ICD-10-CM | POA: Insufficient documentation

## 2021-04-13 DIAGNOSIS — Z1379 Encounter for other screening for genetic and chromosomal anomalies: Secondary | ICD-10-CM

## 2021-04-13 DIAGNOSIS — Z923 Personal history of irradiation: Secondary | ICD-10-CM | POA: Diagnosis not present

## 2021-04-13 DIAGNOSIS — Z79811 Long term (current) use of aromatase inhibitors: Secondary | ICD-10-CM | POA: Diagnosis not present

## 2021-04-13 DIAGNOSIS — C50411 Malignant neoplasm of upper-outer quadrant of right female breast: Secondary | ICD-10-CM | POA: Insufficient documentation

## 2021-04-13 HISTORY — DX: Encounter for other screening for genetic and chromosomal anomalies: Z13.79

## 2021-04-13 NOTE — Telephone Encounter (Signed)
Revealed negative genetic testing.  Discussed that we do not know why she has polyposis and breast cancer or why there is cancer in the family. It could be due to a different gene that we are not testing, or maybe our current technology may not be able to pick something up.  It will be important for her to keep in contact with genetics to keep up with whether additional testing may be needed.

## 2021-04-13 NOTE — Progress Notes (Signed)
HPI:  Ms. Heitmeyer was previously seen in the [facility] due to a personal history of polyposis and breast cancer and family history of breast cancer and concerns regarding a hereditary predisposition to cancer. Please refer to our prior cancer genetics clinic note for more information regarding our discussion, assessment and recommendations, at the time. Ms. Navarrete recent genetic test results were disclosed to her, as were recommendations warranted by these results. These results and recommendations are discussed in more detail below.  CANCER HISTORY:  Oncology History  Malignant neoplasm of upper-outer quadrant of right breast in female, estrogen receptor positive ([place])  02/27/2021 Initial Diagnosis   Screening mammogram: a possible mass in the right breast. Diagnostic mammogram: an indeterminate 2 mm mass in the right breast at 12 o'clock. 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  Ki-67 30%   03/27/2021 Cancer Staging   Staging form: Breast, AJCC 8th Edition - Clinical stage from 03/27/2021: Stage IA (cT1a, cN0, cM0, G3, ER+, PR+, HER2-) - Signed by Nicholas Lose, MD on 03/29/2021 Histologic grading system: 3 grade system    04/12/2021 Genetic Testing   Negative genetic testing on the CancerNext-Expanded+RNAinsight panel.  The report date is April 12, 2021.  The CancerNext-Expanded gene panel offered by [facility] and includes sequencing and rearrangement analysis for the following 77 genes: AIP, ALK, APC*, ATM*, AXIN2, BAP1, BARD1, BLM, BMPR1A, BRCA1*, BRCA2*, BRIP1*, CDC73, CDH1*, CDK4, CDKN1B, CDKN2A, CHEK2*, CTNNA1, DICER1, FANCC, FH, FLCN, GALNT12, KIF1B, LZTR1, MAX, MEN1, MET, MLH1*, MSH2*, MSH3, MSH6*, MUTYH*, NBN, NF1*, NF2, NTHL1, PALB2*, PHOX2B, PMS2*, POT1, PRKAR1A, PTCH1, PTEN*, RAD51C*, RAD51D*, RB1, RECQL, RET, SDHA, SDHAF2, SDHB, SDHC, SDHD, SMAD4, SMARCA4, SMARCB1, SMARCE1, STK11, SUFU, TMEM127, TP53*, TSC1, TSC2, VHL and  XRCC2 (sequencing and deletion/duplication); EGFR, EGLN1, HOXB13, KIT, MITF, PDGFRA, POLD1, and POLE (sequencing only); EPCAM and GREM1 (deletion/duplication only). DNA and RNA analyses performed for * genes.      FAMILY HISTORY:  We obtained a detailed, 4-generation family history.  Significant diagnoses are listed below: Family History  Problem Relation Age of Onset   Alzheimer's disease Mother        symptom onset in 41s   Colon cancer Mother        dx 51s   Colon cancer Father        d. 49   Leukemia Sister    Cancer Paternal Aunt        NOS   Dementia Maternal Grandmother    Cancer Paternal Grandmother        NOS - "womans cancer"    The patient has one daughter who is cancer free.  She has one brother and one sister, her sister has a precursor to leukemia.  Both parents are deceased.   The patient's mother had colon cancer in her 30's.  She had two sisters who are cancer free.  Her parents are deceased from non-cancer related issues.   The patient's father had colon cancer and died at 41.  He had one sister and two brothers.  The sister died of an unknown cancer.  His parents are deceased.  The mother died of an unknown cancer under the age of 11's.   Ms. Gauger is unaware of previous family history of genetic testing for hereditary cancer risks. Patient's maternal ancestors are of [place] and [place] descent, and paternal ancestors are of [place] and [place] descent. There is no reported Ashkenazi Jewish ancestry. There is no known consanguinity.  GENETIC TEST RESULTS: Genetic  testing reported out on April 12, 2021 through the CancerNext-Expanded+RNAinsight cancer panel found no pathogenic mutations. The CancerNext-Expanded gene panel offered by [facility] and includes sequencing and rearrangement analysis for the following 77 genes: AIP, ALK, APC*, ATM*, AXIN2, BAP1, BARD1, BLM, BMPR1A, BRCA1*, BRCA2*, BRIP1*, CDC73, CDH1*, CDK4, CDKN1B, CDKN2A, CHEK2*, CTNNA1, DICER1,  FANCC, FH, FLCN, GALNT12, KIF1B, LZTR1, MAX, MEN1, MET, MLH1*, MSH2*, MSH3, MSH6*, MUTYH*, NBN, NF1*, NF2, NTHL1, PALB2*, PHOX2B, PMS2*, POT1, PRKAR1A, PTCH1, PTEN*, RAD51C*, RAD51D*, RB1, RECQL, RET, SDHA, SDHAF2, SDHB, SDHC, SDHD, SMAD4, SMARCA4, SMARCB1, SMARCE1, STK11, SUFU, TMEM127, TP53*, TSC1, TSC2, VHL and XRCC2 (sequencing and deletion/duplication); EGFR, EGLN1, HOXB13, KIT, MITF, PDGFRA, POLD1, and POLE (sequencing only); EPCAM and GREM1 (deletion/duplication only). DNA and RNA analyses performed for * genes. The test report has been scanned into EPIC and is located under the Molecular Pathology section of the Results Review tab.  A portion of the result report is included below for reference.     We discussed with Ms. Andrzejewski that because current genetic testing is not perfect, it is possible there may be a gene mutation in one of these genes that current testing cannot detect, but that chance is small.  We also discussed, that there could be another gene that has not yet been discovered, or that we have not yet tested, that is responsible for the cancer diagnoses in the family. It is also possible there is a hereditary cause for the cancer in the family that Ms. Germani did not inherit and therefore was not identified in her testing.  Therefore, it is important to remain in touch with cancer genetics in the future so that we can continue to offer Ms. Suastegui the most up to date genetic testing.   ADDITIONAL GENETIC TESTING: We discussed with Ms. Terwilliger that her genetic testing was fairly extensive.  If there are genes identified to increase cancer risk that can be analyzed in the future, we would be happy to discuss and coordinate this testing at that time.    CANCER SCREENING RECOMMENDATIONS: Ms. Gwinn test result is considered negative (normal).  This means that we have not identified a hereditary cause for her personal and family history of cancer at this time. Most cancers happen by chance  and this negative test suggests that her cancer may fall into this category.    While reassuring, this does not definitively rule out a hereditary predisposition to cancer. It is still possible that there could be genetic mutations that are undetectable by current technology. There could be genetic mutations in genes that have not been tested or identified to increase cancer risk.  Therefore, it is recommended she continue to follow the cancer management and screening guidelines provided by her oncology and primary healthcare provider.   An individual's cancer risk and medical management are not determined by genetic test results alone. Overall cancer risk assessment incorporates additional factors, including personal medical history, family history, and any available genetic information that may result in a personalized plan for cancer prevention and surveillance  RECOMMENDATIONS FOR FAMILY MEMBERS:  Individuals in this family might be at some increased risk of developing cancer, over the general population risk, simply due to the family history of cancer.  We recommended women in this family have a yearly mammogram beginning at age 95, or 65 years younger than the earliest onset of cancer, an annual clinical breast exam, and perform monthly breast self-exams. Women in this family should also have a gynecological exam as recommended by  their primary provider. All family members should be referred for colonoscopy starting at age 78.  FOLLOW-UP: Lastly, we discussed with Ms. Hinshaw that cancer genetics is a rapidly advancing field and it is possible that new genetic tests will be appropriate for her and/or her family members in the future. We encouraged her to remain in contact with cancer genetics on an annual basis so we can update her personal and family histories and let her know of advances in cancer genetics that may benefit this family.   Our contact number was provided. Ms. Oyervides questions were  answered to her satisfaction, and she knows she is welcome to call us at anytime with additional questions or concerns.   Roma Kayser, [place], [facility] Licensed, Certified Genetic Counselor [email or web address]

## 2021-04-18 ENCOUNTER — Encounter: Payer: Self-pay | Admitting: *Deleted

## 2021-04-30 ENCOUNTER — Ambulatory Visit: Payer: Medicare HMO | Admitting: Physical Therapy

## 2021-05-07 NOTE — Progress Notes (Signed)
?Radiation Oncology         [phone] ?________________________________ ? ?Name: Jordan Romero MRN: [id number]  ?Date: 05/08/2021  DOB: Jan 23, 1951 ? ?Follow-Up Visit Note ? ?Outpatient ? ?CC: London Pepper, MD  Nicholas Lose, MD ? ?Diagnosis:    ?  ICD-10-CM   ?1. Malignant neoplasm of upper-outer quadrant of right breast in female, estrogen receptor positive ([place])  C50.411   ? Z17.0   ?  ?  ?pT1a pN0 ? ? Cancer Staging  ?Malignant neoplasm of upper-outer quadrant of right breast in female, estrogen receptor positive ([place]) ?Staging form: Breast, AJCC 8th Edition ?- Clinical stage from 03/27/2021: Stage IA (cT1a, cN0, cM0, G3, ER+, PR+, HER2-) - Signed by Nicholas Lose, MD on 03/29/2021 ?Histologic grading system: 3 grade system ?  ? ?CHIEF COMPLAINT: Here to discuss management of right breast cancer ? ?Narrative:  The patient returns today for follow-up.   ?  ?Since here initial consultation date of 03/27/21, the patient underwent genetic testing on 03/29/21 which revealed no clinically significant variants detected by +RNAinsight testing.  ? ?The patient opted to proceed with right breast lumpectomy with nodal biopsies on 04/06/21 under the care of Dr. Marlou Starks. Pathology from the procedure revealed: tumor size of 2 mm in greatest dimension; histology of grade 2 invasive carcinoma of no special type; all margins negative for invasive carcinoma; margin status to invasive disease of 13 mm from the posterior margin; nodal status of 2/2 right axillary sentinel lymph node excisions negative for malignancy, and 1/1 right axillary lymph node excision negative for malignancy;  ER status: 100% positive; PR status 10% positive (both with strong staining intensity); Proliferation marker Ki67 at 30%; Her2 status negative; Grade 2. ? ?The patient has met with Dr. Lindi Adie who has recommended adjuvant antiestrogen therapy following RT. The patient will return to Dr. Lindi Adie to further discuss antiestrogen treatment options in the  near future.  ? ?Symptomatically, the patient reports: Lymphedema issues, if any:  Patient denies   ? ?Pain issues, if any:  Patient denies  ? ?SAFETY ISSUES: ?Prior radiation? No ?Pacemaker/ICD? No ?Possible current pregnancy? No--hysterectomy ?Is the patient on methotrexate? No ? ?Current Complaints / other details:  Patient is still not certain she wants to pursue radiation; she and her husband are eager to discuss risks vs benefits with Dr. Isidore Moos today ?   ? ?       ?ALLERGIES:  has No Known Allergies. ? ?Meds: ?Current Outpatient Medications  ?Medication Sig Dispense Refill  ? azaTHIOprine (IMURAN) 50 MG tablet     ? donepezil (ARICEPT) 10 MG tablet Take 1 tablet daily 90 tablet 3  ? dorzolamide-timolol (COSOPT) 22.3-6.8 MG/ML ophthalmic solution dorzolamide 22.3 mg-timolol 6.8 mg/mL eye drops    ? DULoxetine (CYMBALTA) 20 MG capsule Take by mouth.    ? DULoxetine (CYMBALTA) 60 MG capsule duloxetine 60 mg capsule,delayed release    ? HYDROcodone-acetaminophen (NORCO/VICODIN) 5-325 MG tablet Take 1 tablet by mouth every 6 (six) hours as needed for moderate pain or severe pain. 10 tablet 0  ? levothyroxine (SYNTHROID) 75 MCG tablet     ? LISINOPRIL PO Take by mouth.    ? Multiple Vitamin (MULTIVITAMIN) tablet Take 1 tablet by mouth daily.    ? Omega-3 Fatty Acids (FISH OIL PO) Take by mouth.    ? ?No current facility-administered medications for this encounter.  ? ? ?Physical Findings: ? height is _0  (1.651 m) and weight is 216 lb 6 oz (98.1 kg). Her temporal  temperature is 96.4 ?F (35.8 ?C) (abnormal). Her blood pressure is 105/81 and her pulse is 61. Her respiration is 18 and oxygen saturation is 100%. .    ? ?General: Alert and oriented, in no acute distress ?Breast exam reveals satisfactory healing of right breast, no sign of significant swelling or any infection ? ?Lab Findings: ?Lab Results  ?Component Value Date  ? WBC 6.4 02/12/2020  ? HGB 14.8 02/12/2020  ? HCT 47.3 (H) 02/12/2020  ? MCV 97.1  02/12/2020  ? PLT 192 02/12/2020  ? ? ?_0 @ ? ?Radiographic Findings: ?No results found. ? ?Impression/Plan: ?We discussed adjuvant radiotherapy today.  We discussed various fractionation options.  After a lengthy discussion, I confirmed that the patient is comfortable proceeding with antiestrogen therapy for 5 years. The benefit of 1-3 weeks of adjuvant RT would be very small for local control (5-7% absolute risk reduction for in breast relapse over 10 yrs), and given her tiny tumor and margin status, her prognosis is terrific. As we weighed the risks, benefits, and potential side effects of RT, the patient, her husband, and I agree that it is prudent to hold RT and only pursue it if she shows early signs of intolerance to antiestrogen therapy.  I will see her back PRN and I have contacted Dr. Lindi Adie re: her decision. ? ?  ?On date of service, in total, I spent 45 minutes on this encounter. Patient was seen in person.  ?_____________________________________ ? ? ?Eppie Gibson, MD ? ?This document serves as a record of services personally performed by Eppie Gibson, MD. It was created on her behalf by Roney Mans, a trained medical scribe. The creation of this record is based on the scribe's personal observations and the provider's statements to them. This document has been checked and approved by the attending provider. ? ?

## 2021-05-07 NOTE — Progress Notes (Signed)
Location of Breast Cancer:  ?Malignant neoplasm of upper-outer quadrant of right breast in female, estrogen receptor positive ? ?Histology per Pathology Report:  ?04/06/2021 ?FINAL MICROSCOPIC DIAGNOSIS:  ?A. LYMPH NODE, RIGHT AXILLARY #1, SENTINEL, EXCISION:  ?-  Lymph node negative for malignancy (0/1).  ?B. LYMPH NODE, RIGHT AXILLARY, EXCISION:  ?-  Fibrofatty tissue, negative for malignancy.  ?-  No lymph node tissue identified.  ?C. LYMPH NODE, RIGHT AXILLARY #2, SENTINEL, EXCISION:  ?-  Lymph node negative for malignancy (0/1).  ?D. BREAST, RIGHT, LUMPECTOMY:  ?-  Invasive carcinoma of no special type (ductal), grade 2, 2 mm in greatest dimension, all margins negative for invasive carcinoma (closest margin 13 mm, posterior margin).  ?-  Skin negative for malignancy.  ?E. BREAST, RIGHT ADDITIONAL INFERIOR MARGIN, EXCISION:  ?-  Benign breast tissue.  ?-  Negative for malignancy.  ? ?Receptor Status: ER(100%), PR (10%), Her2-neu (Negative), Ki-67(30%) ? ?Did patient present with symptoms (if so, please note symptoms) or was this found on screening mammography?: 02/27/2022 Screening mammogram: a possible mass in the right breast. Diagnostic mammogram: an indeterminate 2 mm mass in the right breast at 12 o'clock. ? ?Past/Anticipated interventions by surgeon, if any:  ?04/24/2021 ?--Dr. Paul Toth (office visit) ?The patient is about 2 weeks status post right breast lumpectomy for breast cancer. She tolerated the surgery well. At this point she will follow-up with medical and radiation oncology to discuss adjuvant therapy. I will plan to see her back in about 6 months ? ?04/06/2021 ?--Dr. Paul Toth ?RIGHT BREAST LUMPECTOMY WITH RADIOACTIVE SEED LOCALIZATION  ?DEEP RIGHT AXILLARY SENTINEL LYMPH NODE BIOPSY ? ?Past/Anticipated interventions by medical oncology, if any:  ?Under care of Dr. Vinay Gudena ?04/13/2021 ?--Recommendations: ?Breast conserving surgery: Grade 2 IDC 2 mm, margins Neg, 0/2 LN neg, ER 100%, PR  10%, Her 2 Neg, Ki 67: 30$ ?Adjuvant radiation therapy (she wants to discuss the pros and cons of radiation) ?Adjuvant antiestrogen therapy with letrozole 2.5 mg daily x5 years ?--No role for Oncotype testing. ?--RTC after XRT ?--If she does not get radiation then we can start her on antiestrogen therapy. ? ?Lymphedema issues, if any:  Patient denies   ? ?Pain issues, if any:  Patient denies  ? ?SAFETY ISSUES: ?Prior radiation? No ?Pacemaker/ICD? No ?Possible current pregnancy? No--hysterectomy ?Is the patient on methotrexate? No ? ?Current Complaints / other details:  Patient is still not certain she wants to pursue radiation; she and her husband are eager to discuss risks vs benefits with Dr. Squire today ?   ? ? ? ?

## 2021-05-08 ENCOUNTER — Ambulatory Visit: Payer: Medicare HMO | Admitting: Radiation Oncology

## 2021-05-08 ENCOUNTER — Ambulatory Visit
Admission: RE | Admit: 2021-05-08 | Discharge: 2021-05-08 | Disposition: A | Discharge status: Home / Self Care | Payer: Medicare HMO | Source: Ambulatory Visit | Attending: Radiation Oncology | Admitting: Radiation Oncology

## 2021-05-08 ENCOUNTER — Encounter: Payer: Self-pay | Admitting: Radiation Oncology

## 2021-05-08 ENCOUNTER — Other Ambulatory Visit: Payer: Self-pay

## 2021-05-08 VITALS — BP 105/81 | HR 61 | Temp 96.4°F | Resp 18 | Ht 65.0 in | Wt 216.4 lb

## 2021-05-08 DIAGNOSIS — C50411 Malignant neoplasm of upper-outer quadrant of right female breast: Secondary | ICD-10-CM | POA: Diagnosis not present

## 2021-05-08 DIAGNOSIS — Z170 Estrogen receptor positive status [ER+]: Secondary | ICD-10-CM | POA: Diagnosis not present

## 2021-05-08 DIAGNOSIS — Z79899 Other long term (current) drug therapy: Secondary | ICD-10-CM | POA: Insufficient documentation

## 2021-05-15 ENCOUNTER — Encounter: Payer: Self-pay | Admitting: *Deleted

## 2021-05-15 ENCOUNTER — Other Ambulatory Visit: Payer: Self-pay | Admitting: *Deleted

## 2021-05-15 ENCOUNTER — Telehealth: Payer: Self-pay | Admitting: *Deleted

## 2021-05-15 DIAGNOSIS — C50411 Malignant neoplasm of upper-outer quadrant of right female breast: Secondary | ICD-10-CM

## 2021-05-15 MED ORDER — LETROZOLE 2.5 MG PO TABS: 2.5000 mg | ORAL_TABLET | Freq: Every day | ORAL | 3 refills | Status: DC

## 2021-05-15 NOTE — Telephone Encounter (Signed)
Spoke with patient to get her started on letrozole. Patient has decided not to pursue xrt.  Verified pharmacy. Prescription sent. Informed her that she would get a call to follow up in 3 months and to call with any questions. Patient verbalized understanding.  ?

## 2021-05-16 ENCOUNTER — Ambulatory Visit: Payer: Medicare HMO | Admitting: Radiation Oncology

## 2021-05-17 ENCOUNTER — Ambulatory Visit: Payer: Medicare HMO

## 2021-05-17 ENCOUNTER — Encounter (HOSPITAL_COMMUNITY): Payer: Self-pay

## 2021-05-18 ENCOUNTER — Ambulatory Visit: Payer: Medicare HMO

## 2021-05-21 ENCOUNTER — Ambulatory Visit: Payer: Medicare HMO

## 2021-05-22 ENCOUNTER — Ambulatory Visit: Payer: Medicare HMO

## 2021-05-23 ENCOUNTER — Ambulatory Visit: Payer: Medicare HMO

## 2021-05-24 ENCOUNTER — Ambulatory Visit: Payer: Medicare HMO

## 2021-05-25 ENCOUNTER — Ambulatory Visit: Payer: Medicare HMO

## 2021-05-28 ENCOUNTER — Ambulatory Visit: Payer: Medicare HMO

## 2021-05-29 ENCOUNTER — Ambulatory Visit: Payer: Medicare HMO

## 2021-05-30 ENCOUNTER — Ambulatory Visit: Payer: Medicare HMO

## 2021-05-30 ENCOUNTER — Telehealth: Payer: Self-pay | Admitting: Adult Health

## 2021-05-30 NOTE — Telephone Encounter (Signed)
.  Called patient to schedule appointment per 3/29, patient husband notified me that they were not interested in the [facility] appt and will follow up as needed, desk nurse and NP notified. ?

## 2021-05-31 ENCOUNTER — Ambulatory Visit: Payer: Medicare HMO

## 2021-06-01 ENCOUNTER — Ambulatory Visit: Payer: Medicare HMO

## 2021-06-04 ENCOUNTER — Ambulatory Visit: Payer: Medicare HMO

## 2021-06-05 ENCOUNTER — Ambulatory Visit: Payer: Medicare HMO

## 2021-06-15 DIAGNOSIS — H35373 Puckering of macula, bilateral: Secondary | ICD-10-CM | POA: Diagnosis not present

## 2021-06-15 DIAGNOSIS — H35353 Cystoid macular degeneration, bilateral: Secondary | ICD-10-CM | POA: Diagnosis not present

## 2021-06-15 DIAGNOSIS — H3552 Pigmentary retinal dystrophy: Secondary | ICD-10-CM | POA: Diagnosis not present

## 2021-07-09 ENCOUNTER — Ambulatory Visit: Payer: Medicare HMO | Attending: General Surgery

## 2021-07-16 DIAGNOSIS — H35353 Cystoid macular degeneration, bilateral: Secondary | ICD-10-CM | POA: Diagnosis not present

## 2021-07-16 DIAGNOSIS — H35373 Puckering of macula, bilateral: Secondary | ICD-10-CM | POA: Diagnosis not present

## 2021-07-19 ENCOUNTER — Encounter: Payer: Self-pay | Admitting: Physician Assistant

## 2021-07-19 ENCOUNTER — Ambulatory Visit: Payer: Medicare HMO | Admitting: Physician Assistant

## 2021-07-19 VITALS — BP 132/83 | HR 101 | Resp 18 | Ht 65.0 in | Wt 217.0 lb

## 2021-07-19 DIAGNOSIS — G3184 Mild cognitive impairment, so stated: Secondary | ICD-10-CM | POA: Diagnosis not present

## 2021-07-19 DIAGNOSIS — F0280 Dementia in other diseases classified elsewhere without behavioral disturbance: Secondary | ICD-10-CM

## 2021-07-19 DIAGNOSIS — G309 Alzheimer's disease, unspecified: Secondary | ICD-10-CM | POA: Diagnosis not present

## 2021-07-19 MED ORDER — MEMANTINE HCL 5 MG PO TABS: ORAL_TABLET | ORAL | 11 refills | Status: DC

## 2021-07-19 NOTE — Progress Notes (Addendum)
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 MMSE today is 21/30 with delayed recall 0/3. Patient is currently on donepezil 10 mg daily, tolerating well.     Recommendations:   Continue donepezil 10 mg daily Side effects were discussed Start memantine 5 mg , take 1 tab at night x 2 weeks then increase to 1 tab bid if tolerated. Repeat Neuropsych evaluation  for diagnostic clarity and trajectory of the disease  Follow up after Neuropsych evaluation     Subjective:    Jordan Romero is a very pleasant 71 y.o. RH female  seen today in follow up for memory loss. This patient is accompanied in the office by her who supplements the history.  Previous records as well as any outside records available were reviewed prior to todays visit.  Patient was last seen at our office on 01/19/21 . Last MoCA on 07/2020 was 16/30    Any changes in memory since last visit? She reports that her STM is worse." Her concept of time is worse. For example she had to be at 4 pm for an appt, and she was ready at 1 pm thinking it was 4 pm.". She continues to read, and do crossword puzzles and word finding.  Patient lives with: Spouse   repeats oneself? Endorsed by husband Disoriented when walking into a room?  Patient denies   Leaving objects in unusual places?  Patient denies   Ambulates  with difficulty?   Patient denies   Recent falls?  Patient denies   Any head injuries?  Patient denies   History of seizures?   Patient denies   Wandering behavior?  Patient denies   Patient drives?   Patient no longer drives    Any mood changes such irritability agitation?  Patient denies   Any history of depression?:  Patient denies   Hallucinations?  Patient denies   Paranoia?  Patient denies   Patient reports that he  sleeps well without vivid dreams, REM behavior or sleepwalking     History of sleep apnea?  Patient denies   Any hygiene concerns?  Patient denies but family reports that " she's not taking care of herself ,does not want to clean or use her fake teeth or hearing aids" Independent of bathing and dressing?  Endorsed  Does the patient needs help with medications? She does not like the pillbox, so she "just take them from the bottle and I don't forget" Who is in charge of the finances?  Husband is in charge  Any changes in appetite?  Patient denies   Patient have trouble swallowing? Patient denies   Does the patient cook?  Patient denies   Any kitchen accidents such as leaving the stove on? Patient denies   Any headaches?  Patient denies   Any issues with vision? She has a history of retinitis pigmentosa and cannot drive because of decreased vision "not because of my memory"-she adds . "My R eye is overstrained" Any focal numbness or tingling?  Patient denies   Chronic back pain Patient denies   Unilateral weakness?  Patient denies   Any tremors?  Patient denies   Any history of anosmia?  Patient denies   Any incontinence of urine?  Patient denies   Any bowel dysfunction?   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 Her husband provided additional history prior to the visit and states that the patient thinks this visit is for her abdominal pain, however he had expressed concerns about memory to Dr. Orland Mustard. She states she thought the reason she was referred was for the tightness in her lower abdomen. We discussed her abdominal pain, she reports a tightness in her lower abdominal region that has been ongoing for at least a year. She states it is there all the time, but is not present today. She  may take an Ibuprofen which would help. She was tried on Valium in the past which did help, "but no one would prescribe it for me." There is no associated nausea/vomiting, vaginal symptoms, bowel/bladder dysfunction, back pain, focal numbness/tingling/weakness in her extremities.    She states that her memory has never been that good, and keeps repeating "I think it's more of an attention issue." She states her memory is not terrible but she is somewhat forgetful. 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 She lives with her husband. Her husband started noticing changes 5-6 years ago. He states they would have the same conversation multiple times, asking the same question repeatedly. He states she either has a "total lack of memory or total attention deficit disorder, can't pay attention." She manages her own medications and denies missing any doses. Her husband reported she may get disoriented when she is out of routine when they travel. She stopped driving a year ago due to her vision, she has less vision on her left eye, difficulty with peripheral vision on both eyes. Her husband notes she would get lost driving, she would make a wrong turn from her daughter's house and come home an hour later. There were several dents on the car. Her husband manages finances. She forgets to turn off the stove or oven, she admits to doing this yesterday but states this is not frequent. She misplaces things, her husband reports she would unload the dishwasher and leave all the cabinets open. She denies any word-finding difficulties. Her husband reports there were a couple of nights that she turns on the lights not knowing where she was, she said she was downstairs when they were in the bedroom. No paranoia or hallucinations. No significant personality changes, her husband notes when she misses Cymbalta she is more irritable and argumentative. She states her mood is fine.  When their daughter got married in 2020, she was not interacting with the wedding party. She was found to have earwax and got hearing aids, but only wants to wear them when she watches TV at night. Sleep is good, no daytime drowsiness. Her mother had Alzheimer's disease in her 82s. She drinks a glass of wine every night. No significant head injuries.    She denies any headaches, dizziness, diplopia, dysarthria, dysphagia, neck/back pain, focal numbness/tingling/weakness, bowel/bladder dysfunction. No anosmia, tremors, no falls. She endorses attention issues when younger, but none in college. She recalls her husband making her do a cognitive evaluation in [place] many years ago while she was still working full time, she does not recall results but they were "mixed results" and again states "I do have attention issues, I know that." She takes a daily B12 supplement for low B12 level.  Neurocognitive exam, 07/10/2020 Dr. Melvyn Novas Ms. Secrist' pattern of performance is suggestive of a primary impairment surrounding learning and memory, especially retrieval/consolidation aspects of memory. Performance variability was further exhibited across processing speed, executive functioning (with reasoning and safety/judgment abilities intact), and semantic fluency; however, the former may be impacted by visual acuity concerns. Performance was appropriate relative to age-matched peers across attention/concentration, receptive language, phonemic fluency, confrontation naming, and visuospatial abilities. Ms. Vanhorne generally denied difficulties completing instrumental activities of daily living (ADLs) independently. Her husband did not strongly contradict this. 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    Neuroimaging did not suggest prominent vascular changes to warrant consideration of a  primary vascular etiology. I also do not see compelling evidence for Lewy body or frontotemporal dementia at the present time. A repeat neuropsychological evaluation in 18-24 months (or sooner if functional decline is noted) is recommended to assess the trajectory of future cognitive decline should it occur. This will also aid in future efforts towards improved diagnostic clarity.      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   Pseudophakia of both eyes 12/19/2017   Recurrent urinary tract infection    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     Past Surgical History:  Procedure Laterality Date   ABDOMINAL HYSTERECTOMY     BREAST LUMPECTOMY WITH RADIOACTIVE SEED AND SENTINEL LYMPH NODE BIOPSY Right 04/06/2021   Procedure: RIGHT BREAST LUMPECTOMY WITH RADIOACTIVE SEED AND SENTINEL LYMPH NODE BIOPSY;  Surgeon: Jovita Kussmaul, MD;  Location: [facility];  Service: General;  Laterality: Right;   CESAREAN SECTION       PREVIOUS MEDICATIONS:   CURRENT MEDICATIONS:  Outpatient Encounter Medications as of 07/19/2021  Medication Sig   azaTHIOprine (IMURAN) 50 MG tablet    donepezil (ARICEPT) 10 MG tablet Take 1 tablet daily   dorzolamide-timolol (COSOPT) 22.3-6.8 MG/ML ophthalmic solution dorzolamide 22.3 mg-timolol 6.8 mg/mL eye drops   DULoxetine (CYMBALTA) 20 MG capsule Take by mouth.   DULoxetine (CYMBALTA) 60  MG capsule duloxetine 60 mg capsule,delayed release   HYDROcodone-acetaminophen (NORCO/VICODIN) 5-325 MG tablet Take 1 tablet by mouth every 6 (six) hours as needed for moderate pain or severe pain.   letrozole (FEMARA) 2.5 MG tablet Take 1 tablet (2.5 mg total) by mouth daily.   levothyroxine (SYNTHROID) 75 MCG tablet    LISINOPRIL PO Take by mouth.   memantine (NAMENDA) 5 MG tablet Take 1 tablet (5 mg at night) for 2 weeks, then increase to 1 tablet (5 mg) twice a day   Multiple Vitamin (MULTIVITAMIN) tablet Take 1 tablet by mouth daily.   Omega-3 Fatty Acids (FISH OIL PO) Take by mouth.   No facility-administered encounter medications on file as of 07/19/2021.     Objective:  PHYSICAL EXAMINATION:    VITALS:   Vitals:   07/19/21 0857  BP: 132/83  Pulse: (!) 101  Resp: 18  SpO2: 96%  Weight: 217 lb (98.4 kg)  Height: '5\' 5"'$  (1.651 m)    GEN:  The patient appears stated age and is in NAD. HEENT:  Normocephalic, atraumatic.   Neurological examination:  General: NAD, well-groomed, appears stated age. Orientation: The patient is alert. Oriented to person, place and date Cranial nerves: There is good facial symmetry.The speech is fluent and clear. No aphasia or dysarthria. Fund of knowledge is appropriate. Recent memory impaired and remote memory is normal.  Attention and concentration are normal.  Able to name objects and repeat phrases.  Hearing is intact to conversational tone.    Sensation: Sensation is intact to light touch throughout Motor: Strength is at least antigravity x4. Tremors: none  DTR's 2/4 in UE/LE      05/04/2020    9:00 AM  Montreal Cognitive Assessment   Visuospatial/ Executive (0/5) 3  Naming (0/3) 3  Attention: Read list of digits (0/2) 2  Attention: Read list of letters (0/1) 1  Attention: Serial 7 subtraction starting at 100 (0/3) 1  Language: Repeat phrase (0/2) 2  Language : Fluency (0/1) 0  Abstraction (0/2) 2  Delayed Recall (0/5) 0   Orientation (0/6) 2  Total 16       07/21/2021    2:00 PM  MMSE - Mini Mental State Exam  Orientation to time 0  Orientation to Place 5  Registration 3  Attention/ Calculation 5  Recall 0  Language- name 2 objects 2  Language- repeat 1  Language- follow 3 step command 3  Language- read & follow direction 1  Write a sentence 1  Copy design 0  Total score 21       Movement examination: Tone: There is normal tone in the UE/LE Abnormal movements:  no tremor.  No myoclonus.  No asterixis.   Coordination:  There is no decremation with RAM's. Normal finger to nose  Gait and Station: The patient has no difficulty arising out of a deep-seated chair without the use of the hands. The patient's stride length is good.  Gait is cautious and narrow.   Thank you for allowing [place] the opportunity to participate in the care of this nice patient. Please do not hesitate to contact us for any questions or concerns.   Total time spent on today's visit was 30 minutes dedicated to this patient today, preparing to see patient, examining the patient, ordering tests and/or medications and counseling the patient, documenting clinical information in the EHR or other health record, independently interpreting results and communicating results to the patient/family, discussing treatment and goals, answering patient's questions and coordinating care.  Cc:  London Pepper, MD  Sharene Butters 07/21/2021 8:16 PM    Cc:  London Pepper, MD Sharene Butters, PA-C

## 2021-07-19 NOTE — Patient Instructions (Signed)
It was a pleasure to see you today at our office.   Recommendations:  Follow up in  6 months Repeat the neurocognitive testing  COntinue donepezil 10 mg nightly  Start Memantine '5mg'$  tablets.  Take 1 tablet at bedtime for 2 weeks, then 1 tablet twice daily.   Side effects include dizziness, headache, diarrhea or constipation.  Whom to call:  Memory  decline, memory medications: Call out office [phone]   For psychiatric meds, mood meds: Please have your primary care physician manage these medications.   Counseling regarding caregiver distress, including caregiver depression, anxiety and issues regarding community resources, adult day care programs, adult living facilities, or memory care questions:   Feel free to contact [place], Social Worker at [phone]   For assessment of decision of mental capacity and competency:  Call Dr. Anthoney Harada, geriatric psychiatrist at [phone]  For guidance in geriatric dementia issues please call [facility] [phone]  For guidance regarding [facility] and if placement were needed at the facility, contact Arnell Asal, Social Worker tel: [phone]  If you have any severe symptoms of a stroke, or other severe issues such as confusion,severe chills or fever, etc call 911 or go to the ER as you may need to be evaluate further   Feel free to visit Facebook page " [email or web address]" for tips of how to care for people with memory problems.     RECOMMENDATIONS FOR ALL PATIENTS WITH MEMORY PROBLEMS: 1. Continue to exercise (Recommend 30 minutes of walking everyday, or 3 hours every week) 2. Increase social interactions - continue going to [place] and enjoy social gatherings with friends and family 3. Eat healthy, avoid fried foods and eat more fruits and vegetables 4. Maintain adequate blood pressure, blood sugar, and blood cholesterol level. Reducing the risk of stroke and cardiovascular disease also  helps promoting better memory. 5. Avoid stressful situations. Live a simple life and avoid aggravations. Organize your time and prepare for the next day in anticipation. 6. Sleep well, avoid any interruptions of sleep and avoid any distractions in the bedroom that may interfere with adequate sleep quality 7. Avoid sugar, avoid sweets as there is a strong link between excessive sugar intake, diabetes, and cognitive impairment We discussed the Mediterranean diet, which has been shown to help patients reduce the risk of progressive memory disorders and reduces cardiovascular risk. This includes eating fish, eat fruits and green leafy vegetables, nuts like almonds and hazelnuts, walnuts, and also use olive oil. Avoid fast foods and fried foods as much as possible. Avoid sweets and sugar as sugar use has been linked to worsening of memory function.  There is always a concern of gradual progression of memory problems. If this is the case, then we may need to adjust level of care according to patient needs. Support, both to the patient and caregiver, should then be put into place.    FALL PRECAUTIONS: Be cautious when walking. Scan the area for obstacles that may increase the risk of trips and falls. When getting up in the mornings, sit up at the edge of the bed for a few minutes before getting out of bed. Consider elevating the bed at the head end to avoid drop of blood pressure when getting up. Walk always in a well-lit room (use night lights in the walls). Avoid area rugs or power cords from appliances in the middle of the walkways. Use a walker or a cane if necessary and consider physical therapy for  balance exercise. Get your eyesight checked regularly.  FINANCIAL OVERSIGHT: Supervision, especially oversight when making financial decisions or transactions is also recommended.  HOME SAFETY: Consider the safety of the kitchen when operating appliances like stoves, microwave oven, and blender. Consider  having supervision and share cooking responsibilities until no longer able to participate in those. Accidents with firearms and other hazards in the house should be identified and addressed as well.   ABILITY TO BE LEFT ALONE: If patient is unable to contact 911 operator, consider using LifeLine, or when the need is there, arrange for someone to stay with patients. Smoking is a fire hazard, consider supervision or cessation. Risk of wandering should be assessed by caregiver and if detected at any point, supervision and safe proof recommendations should be instituted.  MEDICATION SUPERVISION: Inability to self-administer medication needs to be constantly addressed. Implement a mechanism to ensure safe administration of the medications.

## 2021-07-21 DIAGNOSIS — F0280 Dementia in other diseases classified elsewhere without behavioral disturbance: Secondary | ICD-10-CM | POA: Insufficient documentation

## 2021-08-12 ENCOUNTER — Other Ambulatory Visit: Payer: Self-pay | Admitting: Physician Assistant

## 2021-08-14 ENCOUNTER — Ambulatory Visit: Payer: Medicare HMO | Admitting: Adult Health

## 2021-08-21 ENCOUNTER — Telehealth: Payer: Self-pay | Admitting: Physician Assistant

## 2021-08-21 NOTE — Telephone Encounter (Signed)
Patients husband called, he needs a refill on wife's memantine '5mg'$ . It needs to be sent to [place]. They are trying to get her meds there instead of [facility]. He said [facility] has sent a request a few times.

## 2021-08-30 IMAGING — CR DG ABDOMEN 2V
2 series · 2 of 2 positions shown · non-contrast
Comparison: None.

CLINICAL DATA: Constipation.  Abdominal bloating.

EXAM:
ABDOMEN - 2 VIEW

[w abdomen upright]
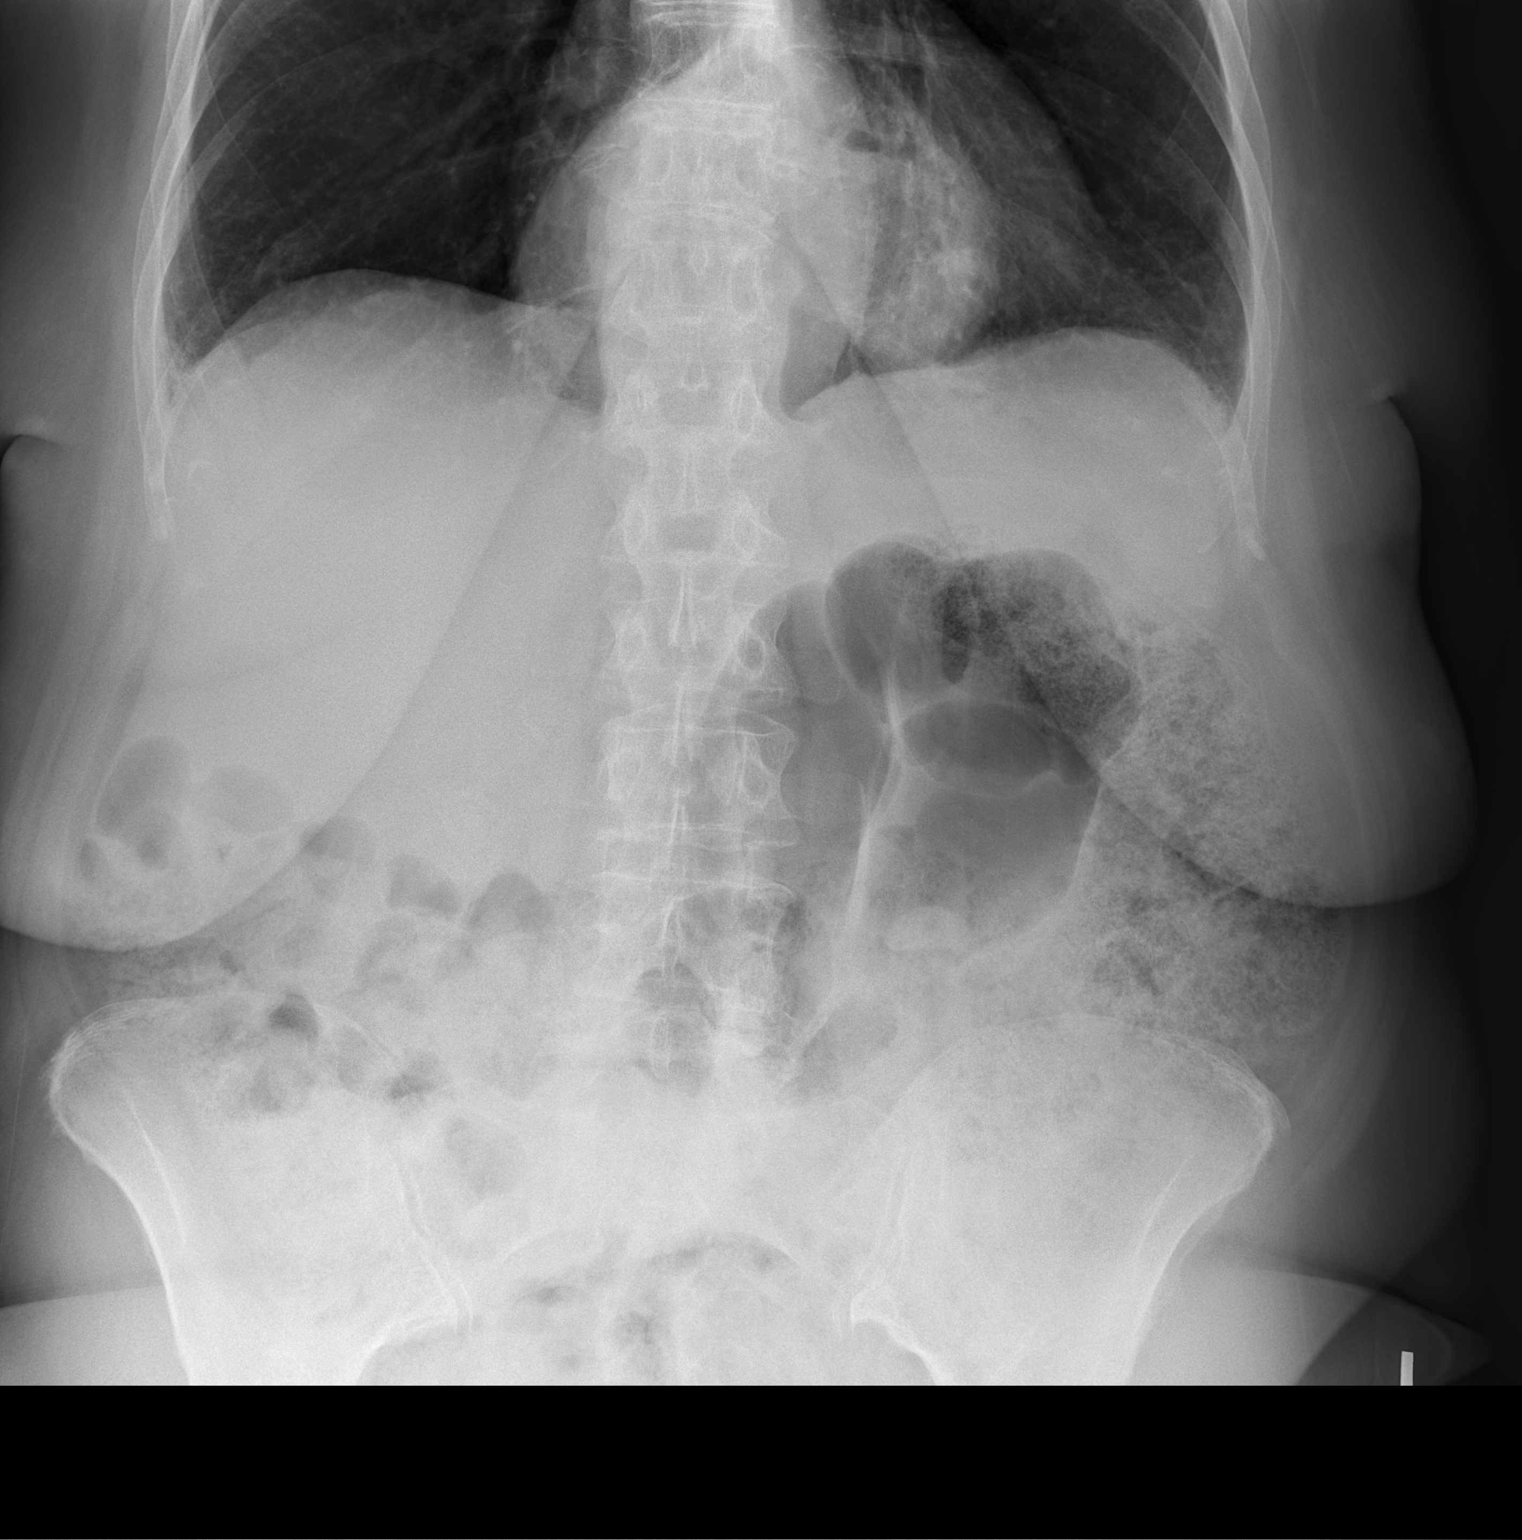

[t abdomen supine]
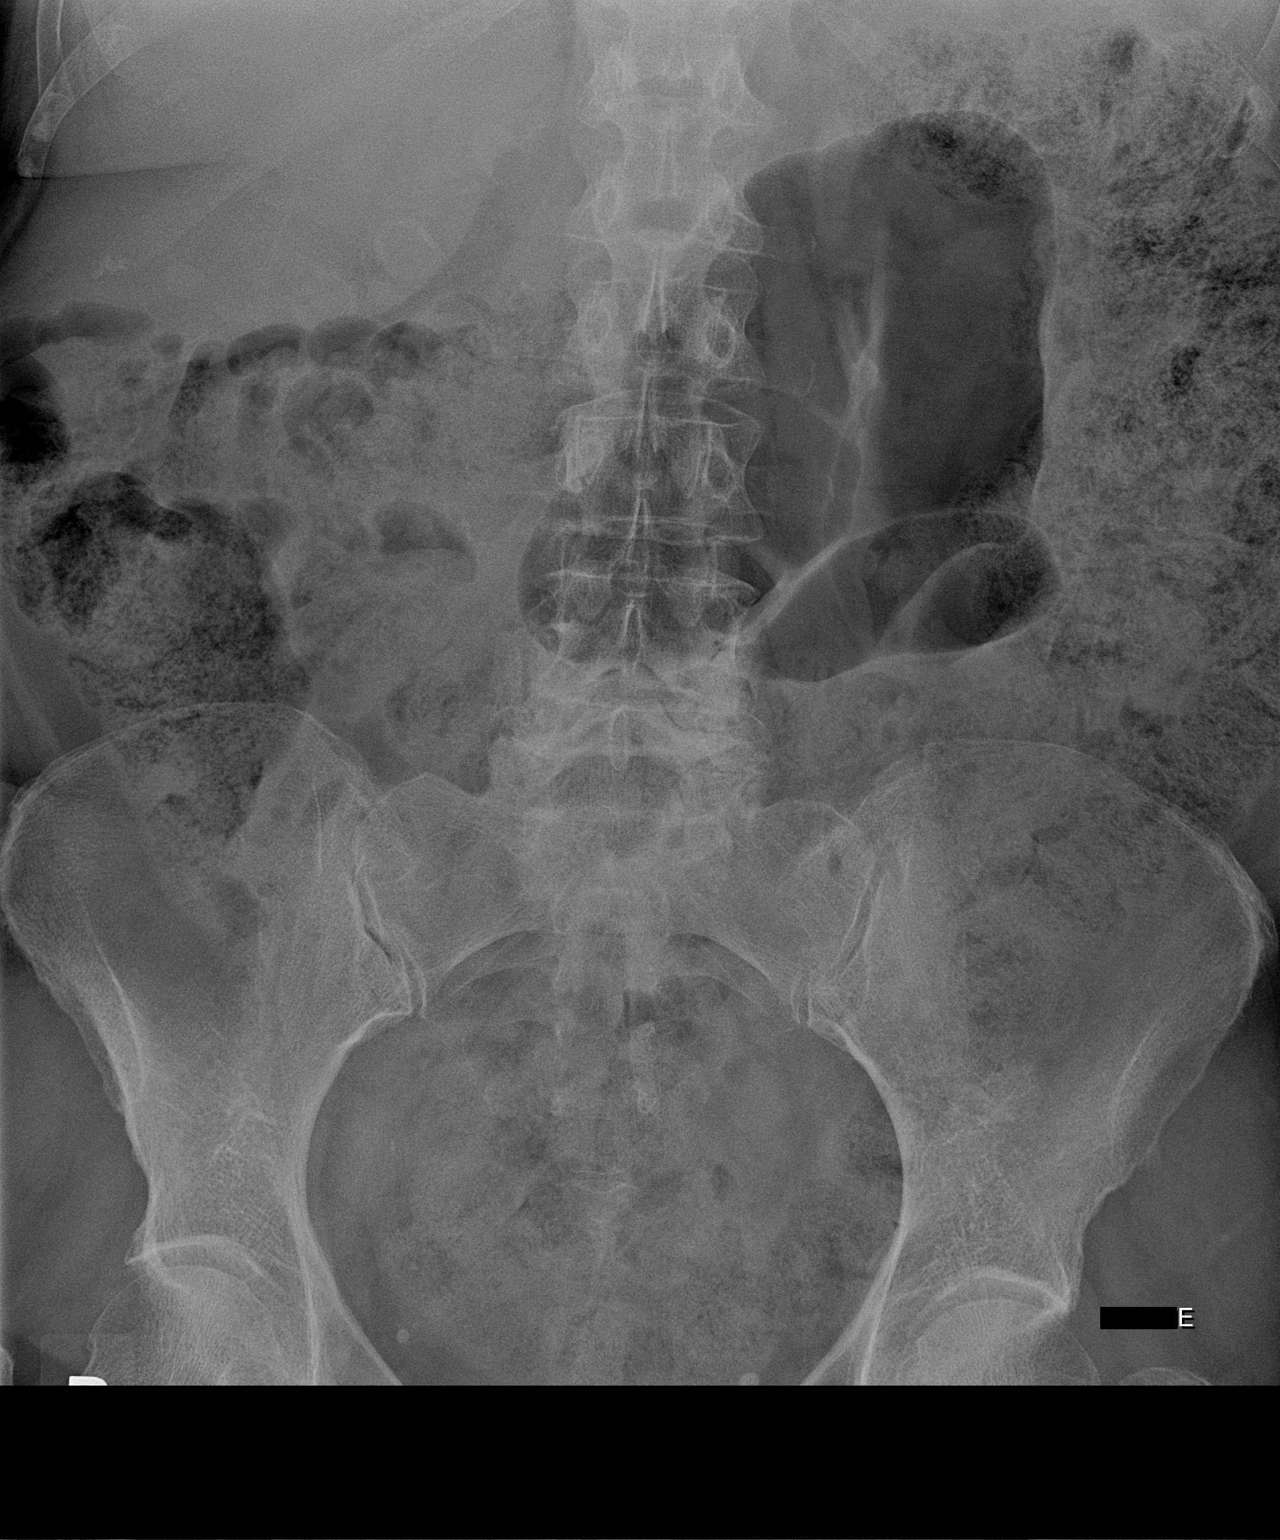

[2 of 2 positions shown; findings below may reference images not displayed]

FINDINGS: Supine and upright views of the abdomen obtained. No free
intra-abdominal air. No small bowel dilatation or evidence of
obstruction. There is a large volume of stool throughout the entire
colon. Small volume of stool in the rectum. Sigmoid colon appears
redundant. Multiple right upper quadrant calcifications suggest
gallstones. Pelvic calcifications are typical of phleboliths. There
is a moderate hiatal hernia. Lung bases otherwise clear. No
concerning intraabdominal mass effect. No acute osseous
abnormalities are seen.
IMPRESSION: 1. Large volume of colonic stool suggesting constipation. No
evidence of bowel obstruction or free air.
2. Probable cholelithiasis.
3. Moderate hiatal hernia.

## 2021-09-26 DIAGNOSIS — H35373 Puckering of macula, bilateral: Secondary | ICD-10-CM | POA: Diagnosis not present

## 2021-09-26 DIAGNOSIS — H3552 Pigmentary retinal dystrophy: Secondary | ICD-10-CM | POA: Diagnosis not present

## 2021-09-26 DIAGNOSIS — H2511 Age-related nuclear cataract, right eye: Secondary | ICD-10-CM | POA: Diagnosis not present

## 2021-09-26 DIAGNOSIS — H35353 Cystoid macular degeneration, bilateral: Secondary | ICD-10-CM | POA: Diagnosis not present

## 2021-10-03 DIAGNOSIS — H401134 Primary open-angle glaucoma, bilateral, indeterminate stage: Secondary | ICD-10-CM | POA: Diagnosis not present

## 2021-10-03 DIAGNOSIS — Z961 Presence of intraocular lens: Secondary | ICD-10-CM | POA: Diagnosis not present

## 2021-10-03 DIAGNOSIS — H2511 Age-related nuclear cataract, right eye: Secondary | ICD-10-CM | POA: Diagnosis not present

## 2021-10-03 DIAGNOSIS — H3552 Pigmentary retinal dystrophy: Secondary | ICD-10-CM | POA: Diagnosis not present

## 2021-10-08 DIAGNOSIS — H401134 Primary open-angle glaucoma, bilateral, indeterminate stage: Secondary | ICD-10-CM | POA: Diagnosis not present

## 2021-10-31 DIAGNOSIS — H3552 Pigmentary retinal dystrophy: Secondary | ICD-10-CM | POA: Diagnosis not present

## 2021-10-31 DIAGNOSIS — H35353 Cystoid macular degeneration, bilateral: Secondary | ICD-10-CM | POA: Diagnosis not present

## 2021-11-13 DIAGNOSIS — H43821 Vitreomacular adhesion, right eye: Secondary | ICD-10-CM | POA: Diagnosis not present

## 2021-11-13 DIAGNOSIS — H35352 Cystoid macular degeneration, left eye: Secondary | ICD-10-CM | POA: Diagnosis not present

## 2021-11-13 DIAGNOSIS — H3552 Pigmentary retinal dystrophy: Secondary | ICD-10-CM | POA: Diagnosis not present

## 2021-11-13 DIAGNOSIS — H35373 Puckering of macula, bilateral: Secondary | ICD-10-CM | POA: Diagnosis not present

## 2021-11-13 DIAGNOSIS — H35353 Cystoid macular degeneration, bilateral: Secondary | ICD-10-CM | POA: Diagnosis not present

## 2021-11-20 DIAGNOSIS — H401134 Primary open-angle glaucoma, bilateral, indeterminate stage: Secondary | ICD-10-CM | POA: Diagnosis not present

## 2021-11-23 DIAGNOSIS — E785 Hyperlipidemia, unspecified: Secondary | ICD-10-CM | POA: Diagnosis not present

## 2021-11-23 DIAGNOSIS — R413 Other amnesia: Secondary | ICD-10-CM | POA: Diagnosis not present

## 2021-11-23 DIAGNOSIS — I10 Essential (primary) hypertension: Secondary | ICD-10-CM | POA: Diagnosis not present

## 2021-11-23 DIAGNOSIS — E538 Deficiency of other specified B group vitamins: Secondary | ICD-10-CM | POA: Diagnosis not present

## 2021-11-23 DIAGNOSIS — H3581 Retinal edema: Secondary | ICD-10-CM | POA: Diagnosis not present

## 2021-11-23 DIAGNOSIS — E039 Hypothyroidism, unspecified: Secondary | ICD-10-CM | POA: Diagnosis not present

## 2021-11-23 DIAGNOSIS — C50919 Malignant neoplasm of unspecified site of unspecified female breast: Secondary | ICD-10-CM | POA: Diagnosis not present

## 2021-11-23 DIAGNOSIS — Z23 Encounter for immunization: Secondary | ICD-10-CM | POA: Diagnosis not present

## 2021-11-23 DIAGNOSIS — Z0000 Encounter for general adult medical examination without abnormal findings: Secondary | ICD-10-CM | POA: Diagnosis not present

## 2022-01-08 DIAGNOSIS — H43821 Vitreomacular adhesion, right eye: Secondary | ICD-10-CM | POA: Diagnosis not present

## 2022-01-08 DIAGNOSIS — H35353 Cystoid macular degeneration, bilateral: Secondary | ICD-10-CM | POA: Diagnosis not present

## 2022-01-08 DIAGNOSIS — H3552 Pigmentary retinal dystrophy: Secondary | ICD-10-CM | POA: Diagnosis not present

## 2022-01-08 DIAGNOSIS — H35373 Puckering of macula, bilateral: Secondary | ICD-10-CM | POA: Diagnosis not present

## 2022-01-18 ENCOUNTER — Other Ambulatory Visit: Payer: Self-pay | Admitting: Hematology and Oncology

## 2022-01-21 ENCOUNTER — Ambulatory Visit: Payer: Medicare HMO | Admitting: Physician Assistant

## 2022-01-21 VITALS — BP 128/84 | HR 84 | Resp 20 | Ht 65.0 in | Wt 225.0 lb

## 2022-01-21 DIAGNOSIS — G3184 Mild cognitive impairment, so stated: Secondary | ICD-10-CM

## 2022-01-21 NOTE — Progress Notes (Addendum)
Assessment/Plan:   Mild Cognitive Impairment   Jordan Romero is a very pleasant 71 y.o. 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  Last MMSE on 07/19/2021 was 21/30.  She is on donepezil 10 mg daily and on memantine 5 mg twice daily, tolerating well.  She has not sought behavioral therapy to date and has discontinued her Cymbalta, family believes that this is contributing to her memory changes, although the patient is anosognostic about her condition.  "I am a therapist I don't need one"    Recommendations:   Follow up in  6 months. Continue to control mood as per PCP, recommend restarting Cymbalta and psychotherapy  Recommend good control of cardiovascular risk factors.   Continue donepezil 10 mg daily. Side effects were discussed Continue Memantine 5  mg twice daily. Side effects were discussed  Repeat neurocognitive testing for clarity of the diagnosis and disease progression    Subjective:   This patient is accompanied in the office by her husband  who supplements the history. Previous records as well as any outside records available were reviewed prior to todays visit.  Last seen on 07/19/21 at which time her MMSE was 21/30     Any changes in memory since last visit?  She continues to have difficulty with concept of time per husband report, names of people and recent conversations although not worse.  She continues to read, no other brain games  repeats oneself? Denies, but hb reports is worse  Disoriented when walking into a room?  Patient denies, only when out of the house if in an unfamiliar place  she may be more disoriented.  Leaving objects in unusual places?  Patient denies   Ambulates  with difficulty?   Patient denies  Walks by herself with the dog without becoming lost Recent falls?   Patient denies   Any head injuries?  Patient denies   History of seizures?   Patient denies   Wandering behavior?  Patient denies   Patient drives?  She no longer drives due to history of retinitis pigmentosa "not because of my memory". Any mood changes since last visit?  Discontinued 3-4 months ago but refuses to take it,  reacts stronger when frustrated with husband after repeating herself. She becomes very defensive about the subject. Cymbalta helps        Any worsening depression?:  Patient denies but family endorses   Hallucinations?  Patient denies   Paranoia?  Patient denies   Patient reports that sleeps well without vivid dreams, REM behavior or sleepwalking   History of sleep apnea?  Patient denies   Any hygiene concerns?  Endorsed "she does not take care of herself, does not want to clean her fake teeth or hearing aids ". Independent of bathing and dressing?  Endorsed  Does the patient needs help with medications? Patient  in charge, husband does not know if she is compliant with the meds but she insists that she is. Who is in charge of the finances?   Husband is in charge    Any changes in appetite?  Patient denies    Patient have trouble swallowing? Patient denies   Does the patient cook?  Patient denies   Any kitchen accidents such as leaving the stove on? Patient denies   Any headaches?  Patient denies   Double vision? R eyesight is  ok, "L eye not so good" Any focal numbness or tingling?  Patient denies   Chronic back pain Patient denies   Unilateral weakness?  Patient denies   Any tremors?  Patient denies   Any history of anosmia?  Patient denies   Any incontinence of urine?  Patient denies   Any bowel dysfunction?   Patient denies      Patient lives  Husband           Initial consultation 05/04/2020 .This is a pleasant 71 year old right-handed woman with a history of hypertension, hyperthyroidism, retinitis pigmentosa on Imuran, presenting for evaluation of neuropathic  pain and memory loss. Her husband provided additional history prior to the visit and states that the patient thinks this visit is for her abdominal pain, however he had expressed concerns about memory to Dr. Orland Mustard. She states she thought the reason she was referred was for the tightness in her lower abdomen. We discussed her abdominal pain, she reports a tightness in her lower abdominal region that has been ongoing for at least a year. She states it is there all the time, but is not present today. She may take an Ibuprofen which would help. She was tried on Valium in the past which did help, "but no one would prescribe it for me." There is no associated nausea/vomiting, vaginal symptoms, bowel/bladder dysfunction, back pain, focal numbness/tingling/weakness in her extremities.    She states that her memory has never been that good, and keeps repeating "I think it's more of an attention issue." She states her memory is not terrible but she is somewhat forgetful. 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 She lives with her husband. Her husband started noticing changes 5-6 years ago. He states they would have the same conversation multiple times, asking the same question repeatedly. He states she either has a "total lack of memory or total attention deficit disorder, can't pay attention." She manages her own medications and denies missing any doses. Her husband reported she may get disoriented when she is out of routine when they travel. She stopped driving a year ago due to her vision, she has less vision on her left eye, difficulty with peripheral vision on both eyes. Her husband notes she would get lost driving, she would make a wrong turn from her daughter's house and come home an hour later. There were several dents on the car. Her husband manages finances. She forgets to turn off the stove or oven, she admits to doing this yesterday but states this is  not frequent. She misplaces things, her husband reports she would unload the dishwasher and leave all the cabinets open. She denies any word-finding difficulties. Her husband reports there were a couple of nights that she turns on the lights not knowing where she was, she said she was downstairs when they were in the bedroom. No paranoia or hallucinations. No significant personality changes, her husband notes when she misses Cymbalta she is more irritable and argumentative. She states her mood is fine. When their daughter got married in 2020, she was not interacting with the wedding party. She was found to have earwax and got hearing aids, but only wants to wear them when she watches TV at night. Sleep is good, no daytime drowsiness. Her mother had Alzheimer's disease in her 2s. She drinks a glass of wine every night. No significant head injuries.    She denies any headaches, dizziness, diplopia,  dysarthria, dysphagia, neck/back pain, focal numbness/tingling/weakness, bowel/bladder dysfunction. No anosmia, tremors, no falls. She endorses attention issues when younger, but none in college. She recalls her husband making her do a cognitive evaluation in [place] many years ago while she was still working full time, she does not recall results but they were "mixed results" and again states "I do have attention issues, I know that." She takes a daily B12 supplement for low B12 level.        Neurocognitive exam, 07/10/2020 Dr. Melvyn Novas Jordan Romero' pattern of performance is suggestive of a primary impairment surrounding learning and memory, especially retrieval/consolidation aspects of memory. Performance variability was further exhibited across processing speed, executive functioning (with reasoning and safety/judgment abilities intact), and semantic fluency; however, the former may be impacted by visual acuity concerns. Performance was appropriate relative to age-matched peers across attention/concentration,  receptive language, phonemic fluency, confrontation naming, and visuospatial abilities. Ms. Wander generally denied difficulties completing instrumental activities of daily living (ADLs) independently. Her husband did not strongly contradict this. 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    Neuroimaging did not suggest prominent vascular changes to warrant consideration of a primary vascular etiology. I also do not see compelling evidence for Lewy body or frontotemporal dementia at the present time. A repeat neuropsychological evaluation in 18-24 months (or sooner if functional decline is noted) is recommended to assess the trajectory of future cognitive decline should it occur. This will also aid in future efforts towards improved diagnostic clarity.    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   Pseudophakia of both eyes 12/19/2017   Recurrent urinary tract infection    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     Past Surgical History:  Procedure Laterality Date   ABDOMINAL HYSTERECTOMY     BREAST LUMPECTOMY WITH RADIOACTIVE SEED AND SENTINEL LYMPH NODE BIOPSY Right 04/06/2021   Procedure: RIGHT BREAST LUMPECTOMY WITH RADIOACTIVE SEED AND SENTINEL LYMPH NODE BIOPSY;  Surgeon: Jovita Kussmaul, MD;   Location: [facility];  Service: General;  Laterality: Right;   CESAREAN SECTION       PREVIOUS MEDICATIONS:   CURRENT MEDICATIONS:  Outpatient Encounter Medications as of 01/21/2022  Medication Sig   azaTHIOprine (IMURAN) 50 MG tablet    donepezil (ARICEPT) 10 MG tablet Take 1 tablet daily   dorzolamide-timolol (COSOPT) 22.3-6.8 MG/ML ophthalmic solution dorzolamide 22.3 mg-timolol 6.8 mg/mL eye drops   DULoxetine (CYMBALTA) 20 MG capsule Take by mouth.   DULoxetine (CYMBALTA) 60 MG capsule duloxetine 60 mg capsule,delayed release   HYDROcodone-acetaminophen (NORCO/VICODIN) 5-325 MG tablet Take 1 tablet by mouth every 6 (six) hours as needed for moderate pain or severe pain.   letrozole (FEMARA) 2.5 MG tablet TAKE 1 TABLET EVERY DAY   levothyroxine (SYNTHROID) 75 MCG tablet  LISINOPRIL PO Take by mouth.   memantine (NAMENDA) 5 MG tablet TAKE 1 TABLET FOR 2 WEEKS, THEN INCREASE TO 1 TABLET TWICE A DAY   Multiple Vitamin (MULTIVITAMIN) tablet Take 1 tablet by mouth daily.   Omega-3 Fatty Acids (FISH OIL PO) Take by mouth.   No facility-administered encounter medications on file as of 01/21/2022.     Objective:     PHYSICAL EXAMINATION:    VITALS:  There were no vitals filed for this visit.  GEN:  The patient appears stated age and is in NAD. HEENT:  Normocephalic, atraumatic.   Neurological examination:  General: NAD, well-groomed, appears stated age. Orientation: The patient is alert. Oriented to person, place and date Cranial nerves: There is good facial symmetry.The speech is fluent and clear. No aphasia or dysarthria. Fund of knowledge is appropriate. Recent memory impaired and remote memory is normal.  Attention and concentration are normal.  Able to name objects and repeat phrases.  Hearing is intact to conversational tone.    Sensation: Sensation is intact to light touch throughout Motor: Strength is at least antigravity x4. Tremors: none  DTR's 2/4  in UE/LE      05/04/2020    9:00 AM  Montreal Cognitive Assessment   Visuospatial/ Executive (0/5) 3  Naming (0/3) 3  Attention: Read list of digits (0/2) 2  Attention: Read list of letters (0/1) 1  Attention: Serial 7 subtraction starting at 100 (0/3) 1  Language: Repeat phrase (0/2) 2  Language : Fluency (0/1) 0  Abstraction (0/2) 2  Delayed Recall (0/5) 0  Orientation (0/6) 2  Total 16       07/21/2021    2:00 PM  MMSE - Mini Mental State Exam  Orientation to time 0  Orientation to Place 5  Registration 3  Attention/ Calculation 5  Recall 0  Language- name 2 objects 2  Language- repeat 1  Language- follow 3 step command 3  Language- read & follow direction 1  Write a sentence 1  Copy design 0  Total score 21       Movement examination: Tone: There is normal tone in the UE/LE Abnormal movements:  no tremor.  No myoclonus.  No asterixis.   Coordination:  There is no decremation with RAM's. Normal finger to nose  Gait and Station: The patient has no difficulty arising out of a deep-seated chair without the use of the hands. The patient's stride length is good.  Gait is cautious and narrow.   Thank you for allowing [place] the opportunity to participate in the care of this nice patient. Please do not hesitate to contact us for any questions or concerns.   Total time spent on today's visit was 32 minutes dedicated to this patient today, preparing to see patient, examining the patient, ordering tests and/or medications and counseling the patient, documenting clinical information in the EHR or other health record, independently interpreting results and communicating results to the patient/family, discussing treatment and goals, answering patient's questions and coordinating care.  Cc:  London Pepper, MD  Sharene Butters 01/21/2022 7:20 AM

## 2022-01-21 NOTE — Patient Instructions (Signed)
It was a pleasure to see you today at our office.   Recommendations:  Follow up in  6 months Repeat the neurocognitive testing  Continue donepezil 10 mg nightly  Continue Memantine '5mg'$  tablets twice daily  Recommend Restart Cymbalta and follow up with primary and recommend psychotherapy   Whom to call:  Memory  decline, memory medications: Call out office [phone]   For psychiatric meds, mood meds: Please have your primary care physician manage these medications.   Counseling regarding caregiver distress, including caregiver depression, anxiety and issues regarding community resources, adult day care programs, adult living facilities, or memory care questions:   Feel free to contact [place], Social Worker at [phone]   For assessment of decision of mental capacity and competency:  Call Dr. Anthoney Harada, geriatric psychiatrist at [phone]  For guidance in geriatric dementia issues please call [facility] [phone]  For guidance regarding [facility] and if placement were needed at the facility, contact Arnell Asal, Social Worker tel: [phone]  If you have any severe symptoms of a stroke, or other severe issues such as confusion,severe chills or fever, etc call 911 or go to the ER as you may need to be evaluate further   Feel free to visit Facebook page " [email or web address]" for tips of how to care for people with memory problems.     RECOMMENDATIONS FOR ALL PATIENTS WITH MEMORY PROBLEMS: 1. Continue to exercise (Recommend 30 minutes of walking everyday, or 3 hours every week) 2. Increase social interactions - continue going to [place] and enjoy social gatherings with friends and family 3. Eat healthy, avoid fried foods and eat more fruits and vegetables 4. Maintain adequate blood pressure, blood sugar, and blood cholesterol level. Reducing the risk of stroke and cardiovascular disease also helps promoting better memory. 5.  Avoid stressful situations. Live a simple life and avoid aggravations. Organize your time and prepare for the next day in anticipation. 6. Sleep well, avoid any interruptions of sleep and avoid any distractions in the bedroom that may interfere with adequate sleep quality 7. Avoid sugar, avoid sweets as there is a strong link between excessive sugar intake, diabetes, and cognitive impairment We discussed the Mediterranean diet, which has been shown to help patients reduce the risk of progressive memory disorders and reduces cardiovascular risk. This includes eating fish, eat fruits and green leafy vegetables, nuts like almonds and hazelnuts, walnuts, and also use olive oil. Avoid fast foods and fried foods as much as possible. Avoid sweets and sugar as sugar use has been linked to worsening of memory function.  There is always a concern of gradual progression of memory problems. If this is the case, then we may need to adjust level of care according to patient needs. Support, both to the patient and caregiver, should then be put into place.    FALL PRECAUTIONS: Be cautious when walking. Scan the area for obstacles that may increase the risk of trips and falls. When getting up in the mornings, sit up at the edge of the bed for a few minutes before getting out of bed. Consider elevating the bed at the head end to avoid drop of blood pressure when getting up. Walk always in a well-lit room (use night lights in the walls). Avoid area rugs or power cords from appliances in the middle of the walkways. Use a walker or a cane if necessary and consider physical therapy for balance exercise. Get your eyesight checked regularly.  FINANCIAL  OVERSIGHT: Supervision, especially oversight when making financial decisions or transactions is also recommended.  HOME SAFETY: Consider the safety of the kitchen when operating appliances like stoves, microwave oven, and blender. Consider having supervision and share cooking  responsibilities until no longer able to participate in those. Accidents with firearms and other hazards in the house should be identified and addressed as well.   ABILITY TO BE LEFT ALONE: If patient is unable to contact 911 operator, consider using [employer], or when the need is there, arrange for someone to stay with patients. Smoking is a fire hazard, consider supervision or cessation. Risk of wandering should be assessed by caregiver and if detected at any point, supervision and safe proof recommendations should be instituted.  MEDICATION SUPERVISION: Inability to self-administer medication needs to be constantly addressed. Implement a mechanism to ensure safe administration of the medications.

## 2022-02-06 ENCOUNTER — Encounter: Payer: Self-pay | Admitting: Physician Assistant

## 2022-02-12 DIAGNOSIS — H401134 Primary open-angle glaucoma, bilateral, indeterminate stage: Secondary | ICD-10-CM | POA: Diagnosis not present

## 2022-02-12 DIAGNOSIS — H401130 Primary open-angle glaucoma, bilateral, stage unspecified: Secondary | ICD-10-CM | POA: Diagnosis not present

## 2022-02-13 ENCOUNTER — Encounter: Payer: Self-pay | Admitting: Physician Assistant

## 2022-03-04 ENCOUNTER — Other Ambulatory Visit: Payer: Self-pay | Admitting: Physician Assistant

## 2022-04-17 ENCOUNTER — Encounter: Payer: Self-pay | Admitting: Psychology

## 2022-04-18 ENCOUNTER — Ambulatory Visit: Payer: Medicare HMO | Admitting: Psychology

## 2022-04-18 ENCOUNTER — Encounter: Payer: Self-pay | Admitting: Psychology

## 2022-04-18 DIAGNOSIS — F0280 Dementia in other diseases classified elsewhere without behavioral disturbance: Secondary | ICD-10-CM

## 2022-04-18 DIAGNOSIS — G309 Alzheimer's disease, unspecified: Secondary | ICD-10-CM

## 2022-04-18 DIAGNOSIS — R4189 Other symptoms and signs involving cognitive functions and awareness: Secondary | ICD-10-CM

## 2022-04-18 HISTORY — DX: Dementia in other diseases classified elsewhere, unspecified severity, without behavioral disturbance, psychotic disturbance, mood disturbance, and anxiety: F02.80

## 2022-04-18 NOTE — Progress Notes (Signed)
   Psychometrician Note   Cognitive testing was administered to [employer] by Milana Kidney, B.S. (psychometrist) under the supervision of Dr. Christia Reading, Ph.D., licensed psychologist on 04/18/2022. Jordan Romero did not appear overtly distressed by the testing session per behavioral observation or responses across self-report questionnaires. Rest breaks were offered.    The battery of tests administered was selected by Dr. Christia Reading, Ph.D. with consideration to Jordan Romero's current level of functioning, the nature of her symptoms, emotional and behavioral responses during interview, level of literacy, observed level of motivation/effort, and the nature of the referral question. This battery was communicated to the psychometrist. Communication between Dr. Christia Reading, Ph.D. and the psychometrist was ongoing throughout the evaluation and Dr. Christia Reading, Ph.D. was immediately accessible at all times. Dr. Christia Reading, Ph.D. provided supervision to the psychometrist on the date of this service to the extent necessary to assure the quality of all services provided.    Jordan Romero will return within approximately 1-2 weeks for an interactive feedback session with Dr. Melvyn Novas at which time her test performances, clinical impressions, and treatment recommendations will be reviewed in detail. Jordan Romero understands she can contact our office should she require our assistance before this time.  A total of 100 minutes of billable time were spent face-to-face with Jordan Romero by the psychometrist. This includes both test administration and scoring time. Billing for these services is reflected in the clinical report generated by Dr. Christia Reading, Ph.D.  This note reflects time spent with the psychometrician and does not include test scores or any clinical interpretations made by Dr. Melvyn Novas. The full report will follow in a separate note.

## 2022-04-18 NOTE — Progress Notes (Signed)
NEUROPSYCHOLOGICAL EVALUATION . [facility]  Date of Evaluation: April 18, 2022  Reason for Referral:   Jordan Romero is a 72 y.o. right-handed Caucasian female referred by Sharene Butters, PA-C, to characterize her current cognitive functioning and assist with diagnostic clarity and treatment planning in the context of a prior diagnosis of an amnestic mild cognitive impairment and concern for further cognitive decline.   Assessment and Plan:   Clinical Impression(s): Jordan Romero' pattern of performance is suggestive of diffuse and often quite severe cognitive impairment. This was most noteworthy across all aspects of learning and memory. However, further impairments were exhibited across processing speed, cognitive flexibility, and visuospatial abilities. Variability was exhibited across verbal fluency (both phonemic and semantic). Performances were appropriate relative to age-matched peers across basic attention and confrontation naming. Relative to her previous evaluation in May 2022, decline was exhibited across several cognitive domains. This was perhaps most notable across yes/no recognition trials of memory tasks. However, decline was also exhibited across processing speed, cognitive flexibility, visuospatial abilities, and encoding (i.e., learning) aspects of memory. Stability was exhibited across basic attention and expressive language.   While Jordan Romero generally denied functional concerns, I do have concern that this is an inaccurate representation given the extent of ongoing impairment and her general denial of cognitive concerns despite objective evidence across two evaluations. Her husband alluded to his perception of difficulties but did not wish to risk upsetting Jordan Romero by speaking about this directly. Overall, given progressive and relatively severe cognitive impairment, Jordan Romero best meets criteria for a Major  Neurocognitive Disorder ("dementia") at the present time.  While the underlying etiology is somewhat unclear, I continue to have concerns surrounding underlying Alzheimer's disease, especially given evidence for progressive memory decline over time. Across memory testing, Jordan Romero did not benefit from repeated exposure to information, was fully amnestic (i.e. 0% retention) across all three memory tasks, and performed very poorly across yes/no recognition trials. 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 Variability across semantic fluency and more consistent visuospatial impairment would generally follow typical disease trajectory. Confrontation naming remained stable and appropriate, which is surprising and encouraging. However, this illness would appear most likely presently.   Her most recent brain MRI (March 2022) was unremarkable and did not suggest prominent microvascular ischemia, making a vascular etiology quite unlikely. She does not display behavioral features of Lewy body disease, another more rare parkinsonian condition, or frontotemporal lobar degeneration. Continued medical monitoring will be important moving forward.   Recommendations: Jordan Romero has already been prescribed two medications aimed to address memory loss and concerns surrounding Alzheimer's disease (i.e., donepezil/Aricept and memantine/Namenda). She is encouraged to continue taking this medication as prescribed. It is important to highlight that these medications have been shown to slow functional decline in some individuals. There is no current treatment which can stop or reverse cognitive decline when caused by a neurodegenerative illness.   Greater irritability and frustration can be a common experience in dementia causing neurological illnesses. I would recommend that Jordan Romero and her husband discuss medication  optimization to help lessen these experiences.   I agree with recommendations made by her medical team and also recommend that, based upon cognitive testing, Jordan Romero fully abstain from all driving behaviors.   If not already done, she and her family may want to discuss her wishes regarding durable power of attorney  and medical decision making, so that she can have input into these choices. Jordan Romero would certainly benefit from home health aids, at least several hours per day. This would become especially important should her husband have to return to being physically in-office during working hours in the future. Additionally, they may wish to discuss future plans for caretaking and seek out community options for in home/residential care should they become necessary.  Should there be further progression of current deficits over time, she is unlikely to regain any independent living skills lost. Therefore, it is recommended that she remain as involved as possible in all aspects of household chores, finances, and medication management, with supervision to ensure adequate performance. She will likely benefit from the establishment and maintenance of a routine in order to maximize her functional abilities over time.  It will be important for her to have another person with her when in situations where she may need to process information, weigh the pros and cons of different options, and make decisions, in order to ensure that she fully understands and recalls all information to be considered.  Jordan Romero is encouraged to attend to lifestyle factors for brain health (e.g., regular physical exercise, good nutrition habits, regular participation in cognitively-stimulating activities, and general stress management techniques), which are likely to have benefits for both emotional adjustment and cognition. Optimal control of vascular risk factors (including safe cardiovascular exercise and adherence to dietary  recommendations) is encouraged. Continued participation in activities which provide mental stimulation and social interaction is also recommended.   Important information should be provided to Jordan Romero in written format in all instances. This information should be placed in a highly frequented and easily visible location within her home to promote recall. External strategies such as written notes in a consistently used memory journal, visual and nonverbal auditory cues such as a calendar on the refrigerator or appointments with alarm, such as on a cell phone, can also help maximize recall.  Review of Records:   Ms. Layer was seen by [facility] Ellouise Newer, M.D.) on 05/04/2020 for an evaluation of memory loss. Ms. Baldassarre stated her belief that her memory has never been that good and was noted to repeat "I think it's more of an attention issue" several times throughout this visit. She did report misplacing things and denied word-finding issues. Her husband started noticing changes 5-6 years ago. He stated they would have the same conversation multiple times and that she would ask the same question repeatedly. He noted that she either has a "total lack of memory or total attention deficit disorder and can't pay attention." Ms. Dean was managing her own medications and denied missing any doses. Her husband reported that she may get disoriented when she is out of routine when they travel. She stopped driving a year ago due to vision difficulties. She has reduced vision in her left eye and difficulty with peripheral vision in both eyes. Her husband described a remote instance where she seemingly got lost while driving, making a wrong turn from her daughter's house and coming home an hour later. There were also said to be several dents in the car. Her husband manages finances. No paranoia, hallucinations, or significant personality changes were reported. However, her husband did note that when she misses  her Cymbalta dose, she is more irritable and argumentative. She denied acute mood concerns. Sleep was described as good and no significant head injuries were reported. She also denied any headaches, dizziness, diplopia, dysarthria, dysphagia,  neck/back pain, focal numbness/tingling/weakness, bowel/bladder dysfunction, anosmia, tremors, or recent falls. Performance on a brief cognitive screening instrument (MOCA) was 16/30. Ultimately, Ms. Gashi was referred for a comprehensive neuropsychological evaluation to characterize her cognitive abilities and to assist with diagnostic clarity and treatment planning.   Brain MRI on 05/12/2020 was largely unremarkable.   She completed a comprehensive neuropsychological evaluation with myself on 07/10/2020. Results suggested a primary impairment surrounding learning and memory, especially retrieval/consolidation aspects of memory. Performance variability was further exhibited across processing speed, executive functioning (with reasoning and safety/judgment abilities intact), and semantic fluency; however, the former may be impacted by visual acuity concerns. Performance was appropriate relative to age-matched peers across attention/concentration, receptive language, phonemic fluency, confrontation naming, and visuospatial abilities. Ms. Luttrull generally denied difficulties completing instrumental activities of daily living (ADLs) independently. Her husband did not strongly contradict this. She was diagnosed with an amnestic mild cognitive impairment presentation. Concerns were expressed for underlying Alzheimer's disease as the cause and repeat testing ws recommended.  She was seen by [facility] Sharene Butters, PA-C) more recently on 07/19/2021 for follow-up. Concerns were expressed surrounding progressively worsening cognitive impairment, especially surrounding short-term memory. Some ADL dysfunction was described. Memantine was added to her medication regimen.  Performance on a brief cognitive screening instrument (MMSE) was 21/30. Ultimately, Ms. Nobles was referred for a repeat neuropsychological evaluation to characterize her cognitive abilities and to assist with diagnostic clarity and treatment planning.   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  The report date is April 12, 2021.     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   Osteoarthritis of left knee 01/18/2021   Pain in pelvis    Peripheral focal chorioretinal inflammation of both eyes 12/19/2017   Pseudophakia of both eyes 12/19/2017   Recurrent urinary tract infection    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    Past Surgical History:  Procedure Laterality Date   ABDOMINAL HYSTERECTOMY     BREAST LUMPECTOMY WITH RADIOACTIVE SEED AND SENTINEL LYMPH NODE BIOPSY Right 04/06/2021   Procedure: RIGHT BREAST LUMPECTOMY WITH RADIOACTIVE SEED AND  SENTINEL LYMPH NODE BIOPSY;  Surgeon: Jovita Kussmaul, MD;  Location: [facility];  Service: General;  Laterality: Right;   CESAREAN SECTION      Current Outpatient Medications:    azaTHIOprine (IMURAN) 50 MG tablet, , Disp: , Rfl:    donepezil (ARICEPT) 10 MG tablet, TAKE 1 TABLET EVERY DAY, Disp: 90 tablet, Rfl: 3   dorzolamide-timolol (  COSOPT) 22.3-6.8 MG/ML ophthalmic solution, dorzolamide 22.3 mg-timolol 6.8 mg/mL eye drops, Disp: , Rfl:    DULoxetine (CYMBALTA) 20 MG capsule, Take by mouth., Disp: , Rfl:    DULoxetine (CYMBALTA) 60 MG capsule, duloxetine 60 mg capsule,delayed release, Disp: , Rfl:    HYDROcodone-acetaminophen (NORCO/VICODIN) 5-325 MG tablet, Take 1 tablet by mouth every 6 (six) hours as needed for moderate pain or severe pain., Disp: 10 tablet, Rfl: 0   letrozole (FEMARA) 2.5 MG tablet, TAKE 1 TABLET EVERY DAY, Disp: 90 tablet, Rfl: 10   levothyroxine (SYNTHROID) 75 MCG tablet, , Disp: , Rfl:    LISINOPRIL PO, Take by mouth., Disp: , Rfl:    memantine (NAMENDA) 5 MG tablet, TAKE 1 TABLET FOR 2 WEEKS, THEN INCREASE TO 1 TABLET TWICE A DAY, Disp: 180 tablet, Rfl: 4   Multiple Vitamin (MULTIVITAMIN) tablet, Take 1 tablet by mouth daily., Disp: , Rfl:    Omega-3 Fatty Acids (FISH OIL PO), Take by mouth., Disp: , Rfl:   Clinical Interview:   The following information was obtained during a clinical interview with Jordan Romero and her husband prior to cognitive testing.  Cognitive Symptoms: Decreased short-term memory: Denied. Previously, Jordan Romero denied all memory concerns outside of her occasionally misplacing things around her residence. Her husband expressed greater concerns at that time, stating that she seemed to forget conversations minutes after they had occurred. He also noted that she would frequently repeat herself or ask repetitive questions and misplace things. Currently, Jordan Romero denied her perception of any ongoing decline. Her husband described  ongoing decline with similar areas of concern highlighted previously. He also described instances where she will appear very anxious and start looking for him, sometimes immediately after he leaves the same room as her. Concerns surrounding memory were said to be present since around 2014 and have progressively worsened over time. Her husband also expressed some concern surrounding sundowning behaviors.  Decreased long-term memory: Denied. Decreased attention/concentration: Endorsed. She previously acknowledged a longstanding pattern of mild attention deficits going back all the way to early childhood. She denied ever being tested for or diagnosed with ADHD in the past. She did state that these difficulties impacted her academic performance to a mild extent up until she went to college.  Reduced processing speed: Denied. Her husband previously alluded to some trouble processing new information and understanding "logical next steps." He provided an example of where Jordan Romero remained in a parked, hot car while he and their daughter went inside a gas station. Rather than opening the car door to cool down, she remained inside the hot car and was upset at them for allowing her to overheat upon their return.  Difficulties with executive functions: Denied. Her husband reported ongoing difficulties with organization, multi-tasking, and decision making. Trouble with impulsivity was denied. He did describe some personality changes in that Jordan Romero can become defensive quite quickly and has "yelled" at or become very irritated with him whenever he would attempt to provide guidance or reminders.  Difficulties with emotion regulation: Denied. Difficulties with receptive language: Denied. Difficulties with word finding: Denied. Her husband reported some word finding difficulties.  Decreased visuoperceptual ability: Endorsed. However, this was attributed to ongoing visual acuity concerns.    Difficulties completing  ADLs: Somewhat. Ms. Turnier reported managing her medications independently. Separately, her husband estimated that is performed adequately about 70% of the time. He stated that instances where he has attempted to provide assistance or reminders have led to irritation and outbursts;  as such, he has distanced himself somewhat. Her husband manages finances which is longstanding in nature. She does not currently drive due to vision-related difficulties. Medical records do suggest prior concerns surrounding Ms. Connole leaving the stove on. Her husband described the potential for her to attempt to cook something without water in the pot and subsequently burning it.  Additional Medical History: History of traumatic brain injury/concussion: Denied. History of stroke: Denied. History of seizure activity: Denied. History of known exposure to toxins: Denied. Symptoms of chronic pain: Medical records suggest prior bilateral knee pain, pelvis pain, and abdominal pain.  Experience of frequent headaches/migraines: Denied. Frequent instances of dizziness/vertigo: Denied.   Sensory changes: She reported semi-longstanding visual acuity deficits. Her and her husband noted that she essentially cannot see out of her left eye and has notable trouble with peripheral vision bilaterally. She is able to read; however, she generally reads on a tablet or electronic reading device where she can enlarge the print. She also utilizes hearing aids with positive effect. Other sensory changes/difficulties (e.g., taste or smell) were denied.  Balance/coordination difficulties: Denied. She also denied any recent falls.  Other motor difficulties: Denied.  Additional medical history: Since her previous evaluation in May 2022, Ms. Burry was diagnosed with breast cancer. She underwent a lumpectomy and did not require chemotherapy or radiation treatment.   Sleep History: Estimated hours obtained each night: 7 hours.  Difficulties falling  asleep: Denied. Medical records do suggest prior concerns surrounding insomnia.  Difficulties staying asleep: Denied. She described infrequently waking to use the restroom, generally only once per night.  Feels rested and refreshed upon awakening: Endorsed.    History of snoring: Endorsed. History of waking up gasping for air: Denied. Witnessed breath cessation while asleep: Denied.   History of vivid dreaming: Denied. Excessive movement while asleep: Denied. Instances of acting out her dreams: Denied.  Psychiatric/Behavioral Health History: Depression: She previously acknowledged a remote history of mild depressive symptoms, consistent with medical records. Acutely, she described her mood as "okay" and did not feel as though depressive symptoms were playing an active role in her presentation.  Anxiety: Denied. Mania: Denied. Trauma History: Denied. Visual/auditory hallucinations: Denied. Delusional thoughts: Denied.   Tobacco: Denied. Alcohol: She reported very rare alcohol consumption and denied a history of problematic alcohol abuse or dependence.  Recreational drugs: Denied.  Family History: Problem Relation Age of Onset   Alzheimer's disease Mother        symptom onset in 48s   Colon cancer Mother        dx 59s   Colon cancer Father        d. 46   Leukemia Sister    Cancer Paternal Aunt        NOS   Dementia Maternal Grandmother    Cancer Paternal Grandmother        NOS - "woman's cancer"   This information was confirmed by Ms. Evora.  Academic/Vocational History: Highest level of educational attainment: 18 years. 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 She described herself as a good (A/B) student in academic settings. Math was noted as a likely relative weakness.  History of developmental delay: Denied. History of grade repetition: Denied. Enrollment in special education courses:  Denied. History of LD/ADHD: Denied. However, she did state that mild attentional deficits were longstanding in nature dating back to early childhood before becoming less apparent while in college.    Employment:  Retired. She previously worked as a Transport planner.   Evaluation Results:   Behavioral Observations: Ms. Issa was accompanied by her husband, arrived to her appointment on time, and was appropriately dressed and groomed. She appeared alert. Observed gait and station were within normal limits. Gross motor functioning appeared intact upon informal observation and no abnormal movements (e.g., tremors) were noted. Her affect was generally relaxed, but did range appropriately given the subject being discussed during the clinical interview. There were a few instances where she appeared to become mildly defensive, especially whenever her husband would attempt to provide his perception of ongoing issues. Spontaneous speech was fluent and word finding difficulties were not observed during the clinical interview. Thought processes were coherent, organized, and normal in content. Insight into her cognitive difficulties appeared very poor in that she continued to deny cognitive concerns despite current and previous objective testing revealing severe impairment.   During testing, sustained attention was adequate. Task engagement was initially adequate. However, she became increasingly frustrated as the evaluation progressed. This eventually culminated with Ms. Ketchum discontinuing the evaluation prematurely. It was abbreviated in response. Overall, Ms. Tickner was cooperative with the clinical interview and mostly so with subsequent testing procedures.   Adequacy of Effort: The validity of neuropsychological testing is limited by the extent to which the individual being tested may be assumed to have exerted adequate effort during testing. Ms. Cofrancesco expressed her intention to perform to the best of her abilities  and exhibited adequate task engagement and persistence. Scores across stand-alone and embedded performance validity measures were below expectation. However, this is believed to be due to true, severe cognitive impairment rather than poor engagement or attempts to perform poorly. As such, the results of the current evaluation are believed to be a generally valid representation of Ms. Boitnott' current cognitive functioning.  Test Results: Ms. Meece was disoriented at the time of the current evaluation. She incorrectly stated the year as "1973" on two separate occasions. She was also unable to state the current month, date, day of the week, or time.   Intellectual abilities based upon educational and vocational attainment were estimated to be in the average to above average range. Premorbid abilities were estimated to be within the average range based upon a single-word reading test.   Processing speed was exceptionally low. Basic attention was average. More complex attention (e.g., working memory) was unable to be assessed due to increasing frustration and limited testing tolerance. Cognitive flexibility was exceptionally low to well below average. Other aspects of executive functioning were unable to be assessed.  Assessed receptive language abilities were unable to be assessed. Ms. Shealy did exhibit some difficulties understanding task instructions across the evaluation, especially tasks more complex in nature. Assessed expressive language was variable. Phonemic fluency was well below average to average, semantic fluency was exceptionally low to below average, and confrontation naming was average.  Assessed visuospatial/visuoconstructional abilities were exceptionally low outside her drawing of a clock. Points were lost on her copy of a complex figure due to very poor attention to detail as Ms. Labrum omitted several internal and external aspects entirely.    Learning (i.e., encoding) of novel verbal  information was exceptionally low. Spontaneous delayed recall (i.e., retrieval) of previously learned information was also exceptionally low. Retention rates were 0% across a story learning task, 0% across a list learning task, and 0% across a shape learning task. Performance across recognition tasks was very poor, suggesting negligible evidence for information consolidation.   Results of emotional  screening instruments suggested that recent symptoms of generalized anxiety were in the minimal range, while symptoms of depression were within normal limits. A screening instrument assessing recent sleep quality suggested the presence of minimal sleep dysfunction.  Tables of Scores:   Note: This summary of test scores accompanies the interpretive report and should not be considered in isolation without reference to the appropriate sections in the text. Descriptors are based on appropriate normative data and may be adjusted based on clinical judgment. Terms such as "Within Normal Limits" and "Outside Normal Limits" are used when a more specific description of the test score cannot be determined. Descriptors refer to the current evaluation only.        Percentile - Normative Descriptor > 98 - Exceptionally High 91-97 - Well Above Average 75-90 - Above Average 25-74 - Average 9-24 - Below Average 2-8 - Well Below Average < 2 - Exceptionally Low        Validity:    DESCRIPTOR   May 2022 Current    Dot Counting Test: --- --- --- Outside Normal Limits  RBANS Effort Index: --- --- --- Outside Normal Limits        Orientation:       Raw Score Raw Score Percentile   NAB Orientation, Form 1 23/29 21/29 --- ---        Cognitive Screening:       Raw Score Raw Score Percentile   SLUMS: 11/30 8/30 --- ---        RBANS, Form A: Standard Score/ Scaled Score Standard Score/ Scaled Score Percentile   Total Score 73 53 <1 Exceptionally Low  Immediate Memory 73 53 <1 Exceptionally Low    List Learning 4 2  <1 Exceptionally Low    Story Memory 6 3 1 $ Exceptionally Low  Visuospatial/Constructional 81 58 <1 Exceptionally Low    Figure Copy 7 1 <1 Exceptionally Low    Line Orientation 14/20 8/20 <2 Exceptionally Low  Language 88 82 12 Below Average    Picture Naming 10/10 9/10 26-50 Average    Semantic Fluency 5 3 1 $ Exceptionally Low  Attention 94 75 5 Well Below Average    Digit Span 15 10 50 Average    Coding 3 2 <1 Exceptionally Low  Delayed Memory 56 40 <1 Exceptionally Low    List Recall 0/10 0/10 <2 Exceptionally Low    List Recognition 17/20 11/20 <2 Exceptionally Low    Story Recall 1 1 <1 Exceptionally Low    Story Recognition 7/12 6/12 3-4 Well Below Average    Figure Recall 1 1 <1 Exceptionally Low    Figure Recognition 2/8 0/8 1 Exceptionally Low         Intellectual Functioning:       Standard Score Standard Score Percentile   Test of Premorbid Functioning: 104 103 58 Average        Attention/Executive Function:      Trail Making Test (TMT): Raw Score (T Score) Raw Score (T Score) Percentile     Part A 55 secs.,  0 errors (33) 95 secs.,  1 error (<19) <1 Exceptionally Low    Part B 130 secs.,  0 errors (34) 241 secs.,  1 error (25) 1 Exceptionally Low         D-KEFS Verbal Fluency Test: Raw Score (Scaled Score) Raw Score (Scaled Score) Percentile     Letter Total Correct 31 (9) 27 (8) 25 Average    Category Total Correct 22 (6) 23 (6) 9  Below Average    Category Switching Total Correct 7 (4) 7 (4) 2 Well Below Average    Category Switching Accuracy 6 (5) 6 (5) 5 Well Below Average      Total Set Loss Errors 0 (13) 1 (11) 63 Average      Total Repetition Errors 8 (5) 4 (9) 37 Average        Language:      Verbal Fluency Test: Raw Score (T Score) Raw Score (T Score) Percentile     Phonemic Fluency (FAS) 31 (37) 27 (34) 5 Well Below Average    Animal Fluency 10 (22) 13 (30) 2 Well Below Average         NAB Language Module, Form 1: T Score T Score Percentile      Naming 29/31 (44) 29/31 (44) 27 Average        Visuospatial/Visuoconstruction:       Raw Score Raw Score Percentile   Clock Drawing: 8/10 8/10 --- Within Normal Limits        Mood and Personality:       Raw Score Raw Score Percentile   Geriatric Depression Scale: 4 1 --- Within Normal Limits  Geriatric Anxiety Scale: 1 0 --- Minimal    Somatic 1 0 --- Minimal    Cognitive 0 0 --- Minimal    Affective 0 0 --- Minimal        Additional Questionnaires:       Raw Score Raw Score Percentile   PROMIS Sleep Disturbance Questionnaire: 22 13 --- None to Slight   Informed Consent and Coding/Compliance:   The current evaluation represents a clinical evaluation for the purposes previously outlined by the referral source and is in no way reflective of a forensic evaluation.   Ms. Moynahan was provided with a verbal description of the nature and purpose of the present neuropsychological evaluation. Also reviewed were the foreseeable risks and/or discomforts and benefits of the procedure, limits of confidentiality, and mandatory reporting requirements of this provider. The patient was given the opportunity to ask questions and receive answers about the evaluation. Oral consent to participate was provided by the patient.   This evaluation was conducted by Christia Reading, Ph.D., ABPP-CN, board certified clinical neuropsychologist. Ms. Novelo completed a clinical interview with Dr. Melvyn Novas, billed as one unit [phone], and 100 minutes of cognitive testing and scoring, billed as one unit [phone] and two additional units [id number]. Psychometrist Milana Kidney, B.S., assisted Dr. Melvyn Novas with test administration and scoring procedures. As a separate and discrete service, Dr. Melvyn Novas spent a total of 160 minutes in interpretation and report writing billed as one unit [phone] and two units [id number].

## 2022-04-19 ENCOUNTER — Encounter: Payer: Self-pay | Admitting: Psychology

## 2022-04-26 ENCOUNTER — Ambulatory Visit: Payer: Medicare HMO | Admitting: Psychology

## 2022-04-26 DIAGNOSIS — F0280 Dementia in other diseases classified elsewhere without behavioral disturbance: Secondary | ICD-10-CM | POA: Diagnosis not present

## 2022-04-26 DIAGNOSIS — G309 Alzheimer's disease, unspecified: Secondary | ICD-10-CM | POA: Diagnosis not present

## 2022-04-26 NOTE — Progress Notes (Signed)
   Neuropsychology Feedback Session [name]. [place] [facility] Department of Neurology  Reason for Referral:   Jordan Romero is a 72 y.o. right-handed Caucasian female referred by Sharene Butters, PA-C, to characterize her current cognitive functioning and assist with diagnostic clarity and treatment planning in the context of a prior diagnosis of an amnestic mild cognitive impairment and concern for further cognitive decline.   Feedback:   [name] completed a comprehensive neuropsychological evaluation on 04/18/2022. Please refer to that encounter for the full report and recommendations. Briefly, results suggested diffuse and often quite severe cognitive impairment. This was most noteworthy across all aspects of learning and memory. However, further impairments were exhibited across processing speed, cognitive flexibility, and visuospatial abilities. Variability was exhibited across verbal fluency (both phonemic and semantic). Relative to her previous evaluation in May 2022, decline was exhibited across several cognitive domains. This was perhaps most notable across yes/no recognition trials of memory tasks. However, decline was also exhibited across processing speed, cognitive flexibility, visuospatial abilities, and encoding (i.e., learning) aspects of memory. While the underlying etiology is somewhat unclear, I continue to have concerns surrounding underlying Alzheimer's disease, especially given evidence for progressive memory decline over time. Across memory testing, [name] did not benefit from repeated exposure to information, was fully amnestic (i.e. 0% retention) across all three memory tasks, and performed very poorly across yes/no recognition trials. 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 Variability across semantic fluency and more consistent visuospatial impairment  would generally follow typical disease trajectory. Confrontation naming remained stable and appropriate, which is surprising and encouraging. However, this illness would appear most likely presently.  [name] was accompanied by her husband during the current feedback session. Content of the current session focused on the results of her neuropsychological evaluation. [name] was given the opportunity to ask questions and her questions were answered. She was encouraged to reach out should additional questions arise. A copy of her report was provided at the conclusion of the visit.      25 minutes were spent conducting the current feedback session with [name], billed as one unit [id number].

## 2022-05-15 ENCOUNTER — Telehealth: Payer: Self-pay | Admitting: Anesthesiology

## 2022-05-15 NOTE — Telephone Encounter (Signed)
Patients husband and been responded to on Mychart

## 2022-05-15 NOTE — Telephone Encounter (Signed)
Pt's husband called requesting to speak to Dr Melvyn Novas, regarding pt's condition.

## 2022-06-05 IMAGING — MG MM PLC BREAST LOC DEV 1ST LESION INC*R*
8 series · 8 of 8 positions shown · non-contrast
Comparison: Previous exam(s).

CLINICAL DATA: Localization prior to surgery

EXAM:
MAMMOGRAPHIC GUIDED RADIOACTIVE SEED LOCALIZATION OF THE RIGHT
BREAST

[R ML (1 of 5)]
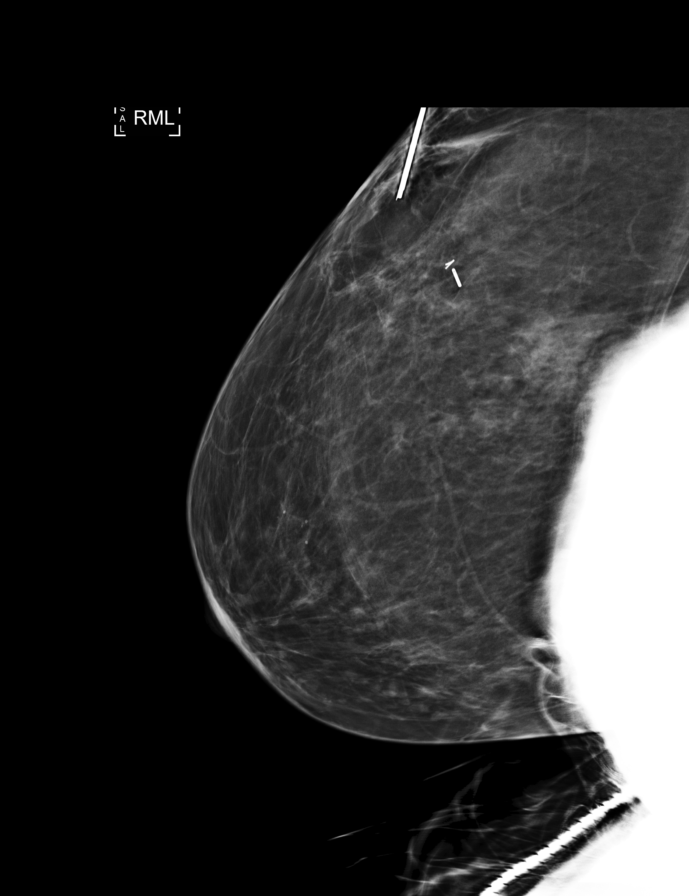

[R ML (2 of 5)]
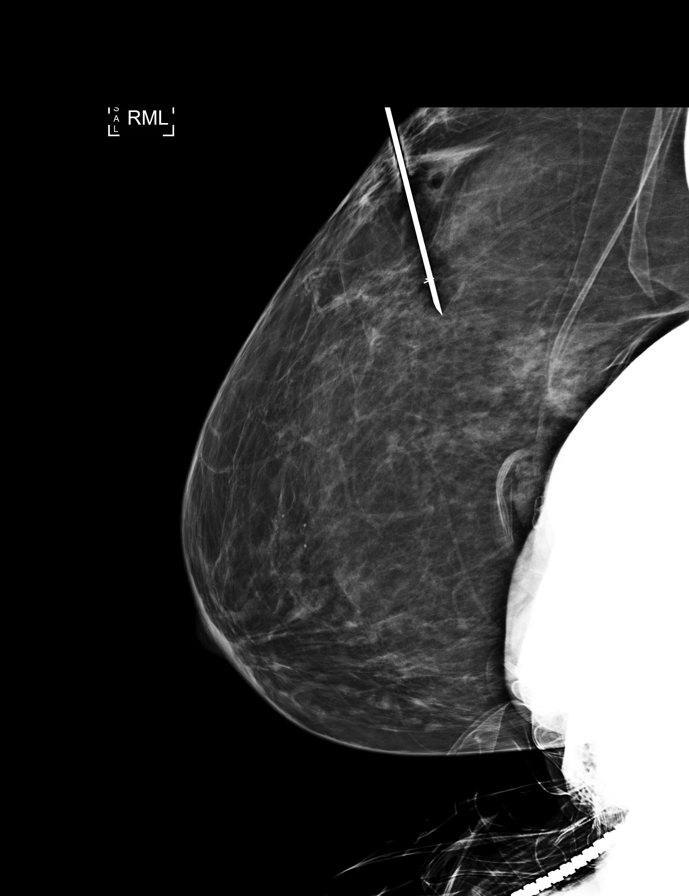

[R ML (3 of 5)]
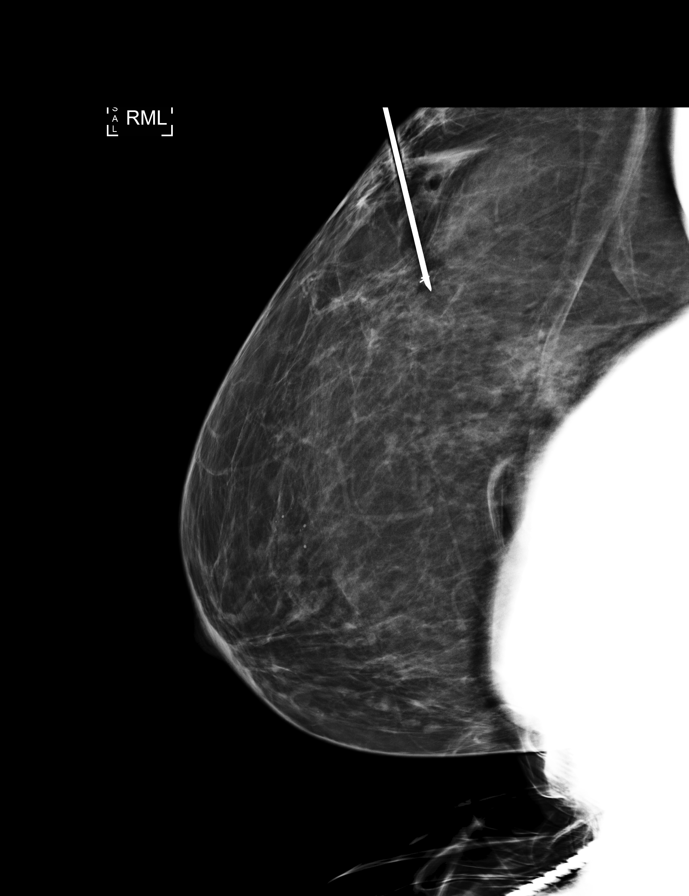

[R ML (4 of 5)]
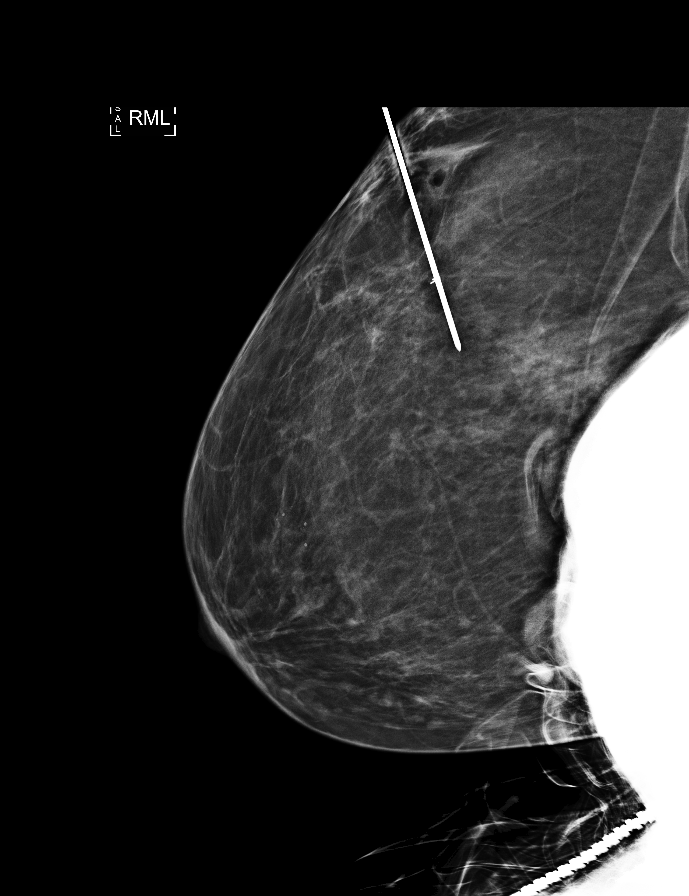

[R CC (1 of 3)]
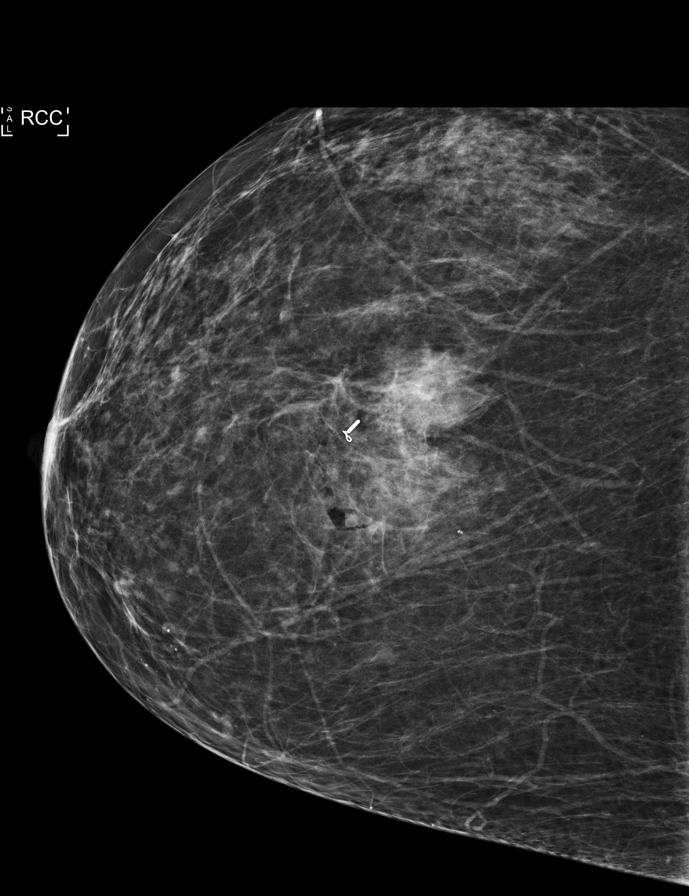

[R ML (5 of 5)]
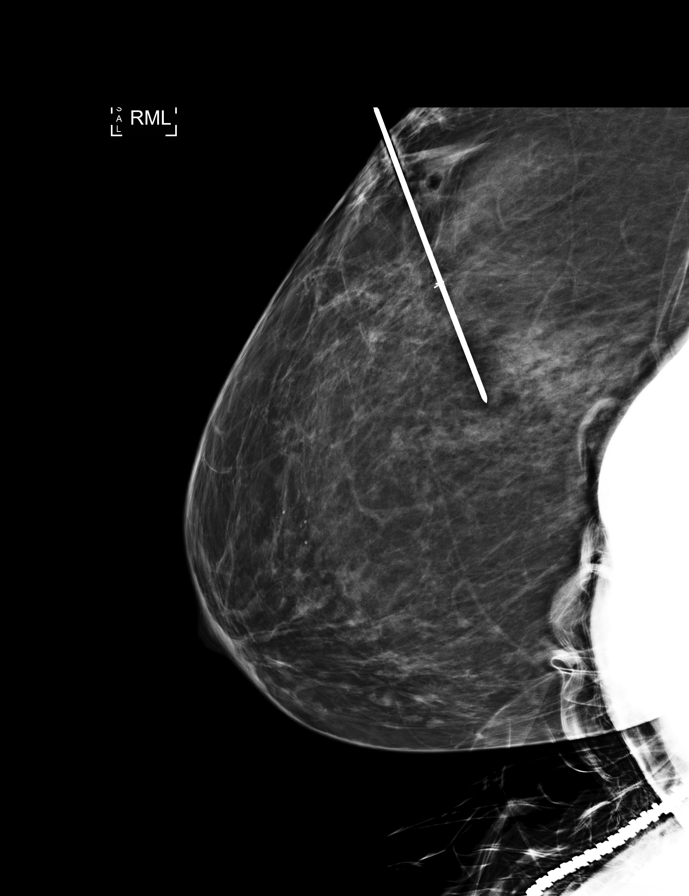

[R CC (2 of 3)]
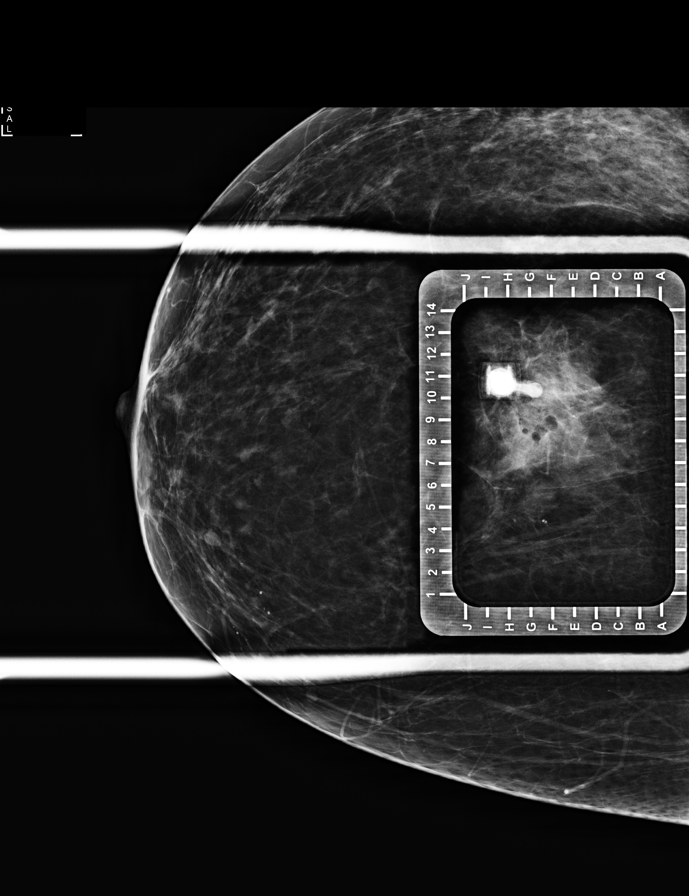

[R CC (3 of 3)]
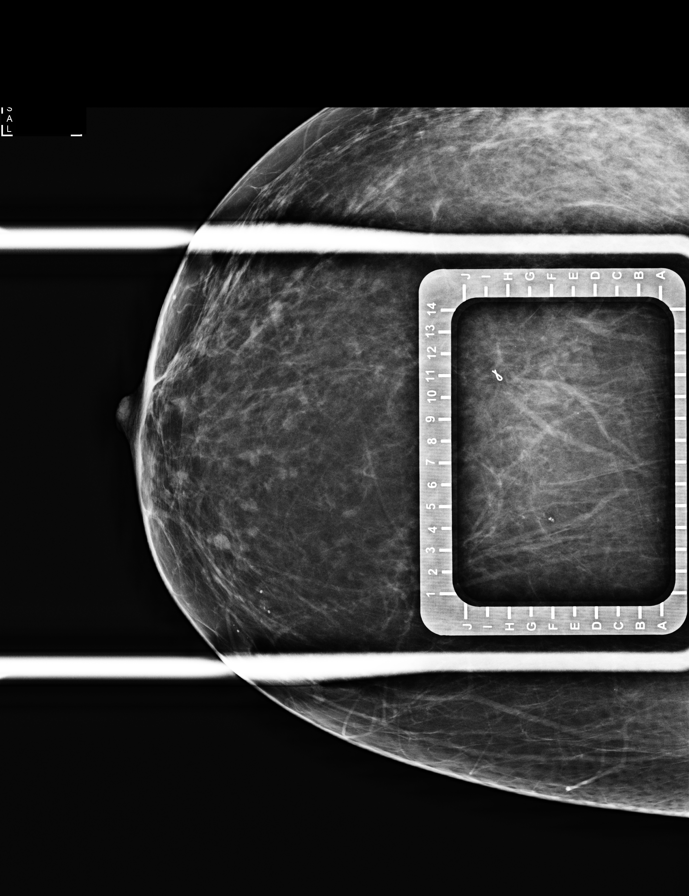

[8 of 8 positions shown; findings below may reference images not displayed]



The usual time-out protocol was performed immediately prior to the
procedure.

Using mammographic guidance, sterile technique, 1% lidocaine and an
D-XO6 radioactive seed, the biopsy clip was localized using a
superior approach. The follow-up mammogram images confirm the seed
in the expected location and were marked for the surgeon.

Follow-up survey of the patient confirms presence of the radioactive
seed.

Order number of D-XO6 seed:  [id number].

Total activity:  0.251 millicuries reference Date: April 02, 2021

The patient tolerated the procedure well and was released from the
[REDACTED]. She was given instructions regarding seed removal.
IMPRESSION: Radioactive seed localization right breast. No apparent
complications.

## 2022-06-17 ENCOUNTER — Telehealth: Payer: Self-pay | Admitting: Anesthesiology

## 2022-06-17 NOTE — Telephone Encounter (Signed)
Pt called stating he would like to speak to Dr Karel Jarvis for 5 minutes regarding pt's health condition.

## 2022-06-18 NOTE — Telephone Encounter (Signed)
Called and left message to call office

## 2022-06-20 NOTE — Telephone Encounter (Addendum)
Patients spouse called back, they need a letter that states patient has dementia and needs [place] to call [employer] to also advise this so he can get his name added to her account. [employer]'s number [phone].

## 2022-06-24 DIAGNOSIS — H401132 Primary open-angle glaucoma, bilateral, moderate stage: Secondary | ICD-10-CM | POA: Diagnosis not present

## 2022-06-24 NOTE — Telephone Encounter (Signed)
Pt's husband returned [place] call.

## 2022-07-09 DIAGNOSIS — H43821 Vitreomacular adhesion, right eye: Secondary | ICD-10-CM | POA: Diagnosis not present

## 2022-07-09 DIAGNOSIS — H401130 Primary open-angle glaucoma, bilateral, stage unspecified: Secondary | ICD-10-CM | POA: Diagnosis not present

## 2022-07-09 DIAGNOSIS — H4321 Crystalline deposits in vitreous body, right eye: Secondary | ICD-10-CM | POA: Diagnosis not present

## 2022-07-09 DIAGNOSIS — H401134 Primary open-angle glaucoma, bilateral, indeterminate stage: Secondary | ICD-10-CM | POA: Diagnosis not present

## 2022-07-09 DIAGNOSIS — H35353 Cystoid macular degeneration, bilateral: Secondary | ICD-10-CM | POA: Diagnosis not present

## 2022-07-09 DIAGNOSIS — H2511 Age-related nuclear cataract, right eye: Secondary | ICD-10-CM | POA: Diagnosis not present

## 2022-07-09 DIAGNOSIS — H35373 Puckering of macula, bilateral: Secondary | ICD-10-CM | POA: Diagnosis not present

## 2022-07-09 DIAGNOSIS — H35033 Hypertensive retinopathy, bilateral: Secondary | ICD-10-CM | POA: Diagnosis not present

## 2022-07-09 DIAGNOSIS — H3552 Pigmentary retinal dystrophy: Secondary | ICD-10-CM | POA: Diagnosis not present

## 2022-07-18 ENCOUNTER — Other Ambulatory Visit: Payer: Self-pay | Admitting: General Surgery

## 2022-07-18 DIAGNOSIS — Z9889 Other specified postprocedural states: Secondary | ICD-10-CM

## 2022-07-26 ENCOUNTER — Ambulatory Visit: Payer: Medicare HMO | Admitting: Physician Assistant

## 2022-07-26 ENCOUNTER — Encounter: Payer: Self-pay | Admitting: Physician Assistant

## 2022-07-26 VITALS — BP 138/61 | Resp 18 | Ht 65.0 in | Wt 234.0 lb

## 2022-07-26 DIAGNOSIS — G309 Alzheimer's disease, unspecified: Secondary | ICD-10-CM

## 2022-07-26 DIAGNOSIS — F03918 Unspecified dementia, unspecified severity, with other behavioral disturbance: Secondary | ICD-10-CM

## 2022-07-26 DIAGNOSIS — F0280 Dementia in other diseases classified elsewhere without behavioral disturbance: Secondary | ICD-10-CM

## 2022-07-26 MED ORDER — MEMANTINE HCL 10 MG PO TABS: ORAL_TABLET | ORAL | 3 refills | Status: AC

## 2022-07-26 MED ORDER — DONEPEZIL HCL 10 MG PO TABS: ORAL_TABLET | ORAL | 3 refills | Status: AC

## 2022-07-26 NOTE — Patient Instructions (Addendum)
It was a pleasure to see you today at our office.   Recommendations:  Follow up in  6 months Repeat the neurocognitive testing  Continue donepezil 10 mg nightly  Increase Memantine 10 mg tablets twice daily  Continue Cymbalta  as per Dr. Kateri Plummer.   Whom to call:  Memory  decline, memory medications: Call out office [phone]   For psychiatric meds, mood meds: Please have your primary care physician manage these medications.     For assessment of decision of mental capacity and competency:  Call Dr. Erick Blinks, geriatric psychiatrist at [phone]   For guidance regarding [facility] and if placement were needed at the facility, contact Sidney Ace, Social Worker tel: [phone]  If you have any severe symptoms of a stroke, or other severe issues such as confusion,severe chills or fever, etc call 911 or go to the ER as you may need to be evaluate further      RECOMMENDATIONS FOR ALL PATIENTS WITH MEMORY PROBLEMS: 1. Continue to exercise (Recommend 30 minutes of walking everyday, or 3 hours every week) 2. Increase social interactions - continue going to [place] and enjoy social gatherings with friends and family 3. Eat healthy, avoid fried foods and eat more fruits and vegetables 4. Maintain adequate blood pressure, blood sugar, and blood cholesterol level. Reducing the risk of stroke and cardiovascular disease also helps promoting better memory. 5. Avoid stressful situations. Live a simple life and avoid aggravations. Organize your time and prepare for the next day in anticipation. 6. Sleep well, avoid any interruptions of sleep and avoid any distractions in the bedroom that may interfere with adequate sleep quality 7. Avoid sugar, avoid sweets as there is a strong link between excessive sugar intake, diabetes, and cognitive impairment We discussed the Mediterranean diet, which has been shown to help patients reduce the risk of progressive memory  disorders and reduces cardiovascular risk. This includes eating fish, eat fruits and green leafy vegetables, nuts like almonds and hazelnuts, walnuts, and also use olive oil. Avoid fast foods and fried foods as much as possible. Avoid sweets and sugar as sugar use has been linked to worsening of memory function.  There is always a concern of gradual progression of memory problems. If this is the case, then we may need to adjust level of care according to patient needs. Support, both to the patient and caregiver, should then be put into place.    FALL PRECAUTIONS: Be cautious when walking. Scan the area for obstacles that may increase the risk of trips and falls. When getting up in the mornings, sit up at the edge of the bed for a few minutes before getting out of bed. Consider elevating the bed at the head end to avoid drop of blood pressure when getting up. Walk always in a well-lit room (use night lights in the walls). Avoid area rugs or power cords from appliances in the middle of the walkways. Use a walker or a cane if necessary and consider physical therapy for balance exercise. Get your eyesight checked regularly.  FINANCIAL OVERSIGHT: Supervision, especially oversight when making financial decisions or transactions is also recommended.  HOME SAFETY: Consider the safety of the kitchen when operating appliances like stoves, microwave oven, and blender. Consider having supervision and share cooking responsibilities until no longer able to participate in those. Accidents with firearms and other hazards in the house should be identified and addressed as well.   ABILITY TO BE LEFT ALONE: If patient is  unable to contact 911 operator, consider using [employer], or when the need is there, arrange for someone to stay with patients. Smoking is a fire hazard, consider supervision or cessation. Risk of wandering should be assessed by caregiver and if detected at any point, supervision and safe proof  recommendations should be instituted.  MEDICATION SUPERVISION: Inability to self-administer medication needs to be constantly addressed. Implement a mechanism to ensure safe administration of the medications.

## 2022-07-26 NOTE — Progress Notes (Signed)
Assessment/Plan:   Dementia due to Alzheimer's disease with behavioral disturbance  [facility] Romero is a very pleasant 72 y.o. 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  She is  seen today in follow up for memory loss. Patient is currently on donepezil 10 mg daily and memantine 5 mg twice daily.  MMSE today is 15/30.  Cognitive decline is noted, but the patient is demonstrating lack of insight into her condition.  She needs more vigilance when performing ADLs.    Follow up in 6 months. Recommend good control of her cardiovascular risk factors Continue to control mood as per PCP.  She is on Cymbalta 60 mg daily Continue donepezil 10 mg daily and increase memantine to 10 mg twice daily, side effects discussed. Recommend increased supervision given progression of the disease    Subjective:    This patient is accompanied in the office by her husband who supplements the history.  Previous records as well as any outside records available were reviewed prior to todays visit. Patient was last seen on 01/21/2022   Any changes in memory since last visit?  Her husband reports that her short-term memory is worse, she may forget within 45 seconds.  "The other day she was playing cards with her sisters, I left for a few seconds, when I came back she asked what she was playing ".  She continues to have difficulty with concerns of time, names of people, recent conversations as per husband report.  Patient denies any symptoms, she is demonstrating poor insight into her condition.  She continues to read, she does not do any brain games. She likes playing solitaire Repeats oneself?  Endorsed by her husband, frequently, patient denies. Disoriented  when walking into a room?  Patient denies Outside of the house that she becomes disoriented.  For example, when she went to visit her daughter, her cousin reported that she looked about 3 or 4 times to where the bathrooms were.  Leaving objects in unusual places?  She may lose objects but not in unusual places  Wandering behavior?  denies   Any personality changes since last visit?  Patient is on Cymbalta, which controls her mood, although she may become easily irritable when confronted with her memory issues. Hallucinations or paranoia?  denies   Seizures?    denies    Any sleep changes? Sleeps well.  Denies vivid dreams, REM behavior or sleepwalking   Sleep apnea?   denies   Any hygiene concerns?  Denies, but husband reports that he has to remind her after 3 days of not taking one.    Independent of bathing and dressing?  Endorsed  Does the patient needs help with medications?  Patient is in charge, husband prepares the pillbox   Who is in charge of the finances?  Husband is in charge     Any changes in appetite?  Eats well, likes to snack.     Patient have trouble swallowing?  denies   Does the patient cook? "He took over the cooking"    Any headaches?   denies   Chronic back pain  denies   Ambulates with difficulty?     denies   Recent falls or head injuries? denies     Unilateral weakness, numbness or tingling?denies  Any tremors?  denies   Any anosmia?  Patient denies   Any incontinence of urine?  denies   Any bowel dysfunction?  denies      Patient lives  with husband Does the patient drive? No longer drives   Neuropsychological evaluation 04/26/2022  Briefly, results suggested diffuse and often quite severe cognitive impairment. This was most noteworthy across all aspects of learning and memory. However, further impairments were exhibited across processing speed, cognitive flexibility, and visuospatial abilities. Variability was exhibited across verbal fluency (both phonemic and  semantic). Relative to her previous evaluation in May 2022, decline was exhibited across several cognitive domains. This was perhaps most notable across yes/no recognition trials of memory tasks. However, decline was also exhibited across processing speed, cognitive flexibility, visuospatial abilities, and encoding (i.e., learning) aspects of memory. While the underlying etiology is somewhat unclear, I continue to have concerns surrounding underlying Alzheimer's disease, especially given evidence for progressive memory decline over time. Across memory testing, [facility] Romero did not benefit from repeated exposure to information, was fully amnestic (i.e. 0% retention) across all three memory tasks, and performed very poorly across yes/no recognition trials. 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 Variability across semantic fluency and more consistent visuospatial impairment would generally follow typical disease trajectory. Confrontation naming remained stable and appropriate, which is surprising and encouraging. However, this illness would appear most likely presently.   Initial consultation 05/04/2020 .This is a pleasant 72 year old right-handed woman with a history of hypertension, hyperthyroidism, retinitis pigmentosa on Imuran, presenting for evaluation of neuropathic pain and memory loss. Her husband provided additional history prior to the visit and states that the patient thinks this visit is for her abdominal pain, however he had expressed concerns about memory to Dr. Kateri Plummer. She states she thought the reason she was referred was for the tightness in her lower abdomen. We discussed her abdominal pain, she reports a tightness in her lower abdominal region that has been ongoing for at least a year. She states it is there all the time, but is not present today. She may take an Ibuprofen which would help. She  was tried on Valium in the past which did help, "but no one would prescribe it for me." There is no associated nausea/vomiting, vaginal symptoms, bowel/bladder dysfunction, back pain, focal numbness/tingling/weakness in her extremities.    She states that her memory has never been that good, and keeps repeating "I think it's more of an attention issue." She states her memory is not terrible but she is somewhat forgetful. She finished her Master's degree and worked as a Airline pilot. She retired a couple of years ago. She lives with her husband. Her husband started noticing changes 5-6 years ago. He states they would have the same conversation multiple times, asking the same question repeatedly. He states she either has a "total lack of memory or total attention deficit disorder, can't pay attention." She manages her own medications and denies missing any doses. Her husband reported she may get disoriented when she is out of routine when they travel. She stopped driving a year ago due to her vision, she has less vision on her left eye, difficulty with peripheral vision on both eyes. Her husband notes she would get lost driving, she would make a wrong turn from her daughter's house and come home an hour later. There were several dents on the car. Her husband manages finances. She  forgets to turn off the stove or oven, she admits to doing this yesterday but states this is not frequent. She misplaces things, her husband reports she would unload the dishwasher and leave all the cabinets open. She denies any word-finding difficulties. Her husband reports there were a couple of nights that she turns on the lights not knowing where she was, she said she was downstairs when they were in the bedroom. No paranoia or hallucinations. No significant personality changes, her husband notes when she misses Cymbalta she is more irritable and argumentative. She states her mood is fine. When their daughter got married in 2020, she  was not interacting with the wedding party. She was found to have earwax and got hearing aids, but only wants to wear them when she watches TV at night. Sleep is good, no daytime drowsiness. Her mother had Alzheimer's disease in her 29s. She drinks a glass of wine every night. No significant head injuries.    She denies any headaches, dizziness, diplopia, dysarthria, dysphagia, neck/back pain, focal numbness/tingling/weakness, bowel/bladder dysfunction. No anosmia, tremors, no falls. She endorses attention issues when younger, but none in college. She recalls her husband making her do a cognitive evaluation in [place] many years ago while she was still working full time, she does not recall results but they were "mixed results" and again states "I do have attention issues, I know that." She takes a daily B12 supplement for low B12 level.     PREVIOUS MEDICATIONS:   CURRENT MEDICATIONS:  Outpatient Encounter Medications as of 07/26/2022  Medication Sig   azaTHIOprine (IMURAN) 50 MG tablet    dorzolamide-timolol (COSOPT) 22.3-6.8 MG/ML ophthalmic solution dorzolamide 22.3 mg-timolol 6.8 mg/mL eye drops   DULoxetine (CYMBALTA) 20 MG capsule Take by mouth.   DULoxetine (CYMBALTA) 60 MG capsule duloxetine 60 mg capsule,delayed release   HYDROcodone-acetaminophen (NORCO/VICODIN) 5-325 MG tablet Take 1 tablet by mouth every 6 (six) hours as needed for moderate pain or severe pain.   letrozole (FEMARA) 2.5 MG tablet TAKE 1 TABLET EVERY DAY   levothyroxine (SYNTHROID) 75 MCG tablet    LISINOPRIL PO Take by mouth.   Multiple Vitamin (MULTIVITAMIN) tablet Take 1 tablet by mouth daily.   Omega-3 Fatty Acids (FISH OIL PO) Take by mouth.   [DISCONTINUED] donepezil (ARICEPT) 10 MG tablet TAKE 1 TABLET EVERY DAY   [DISCONTINUED] memantine (NAMENDA) 5 MG tablet TAKE 1 TABLET FOR 2 WEEKS, THEN INCREASE TO 1 TABLET TWICE A DAY   donepezil (ARICEPT) 10 MG tablet TAKE 1 TABLET EVERY DAY   memantine (NAMENDA) 10  MG tablet TAKE  1 TABLET TWICE A DAY   No facility-administered encounter medications on file as of 07/26/2022.       07/26/2022   11:00 AM 07/21/2021    2:00 PM  MMSE - Mini Mental State Exam  Orientation to time 0 0  Orientation to Place 0 5  Registration 3 3  Attention/ Calculation 5 5  Recall 0 0  Language- name 2 objects 1 2  Language- repeat 1 1  Language- follow 3 step command 3 3  Language- read & follow direction 1 1  Write a sentence 1 1  Copy design 0 0  Total score 15 21      05/04/2020    9:00 AM  Montreal Cognitive Assessment   Visuospatial/ Executive (0/5) 3  Naming (0/3) 3  Attention: Read list of digits (0/2) 2  Attention: Read list of letters (0/1) 1  Attention: Serial 7  subtraction starting at 100 (0/3) 1  Language: Repeat phrase (0/2) 2  Language : Fluency (0/1) 0  Abstraction (0/2) 2  Delayed Recall (0/5) 0  Orientation (0/6) 2  Total 16    Objective:     PHYSICAL EXAMINATION:    VITALS:   Vitals:   07/26/22 0917  BP: 138/61  Resp: 18  Weight: 234 lb (106.1 kg)  Height: 5\' 5"  (1.651 m)    GEN:  The patient appears stated age and is in NAD. HEENT:  Normocephalic, atraumatic.   Neurological examination:  General: NAD, well-groomed, appears stated age. Orientation: The patient is alert. Oriented to person, not to place or date.   Cranial nerves: There is good facial symmetry.The speech is fluent and clear. No aphasia or dysarthria. Fund of knowledge is reduced.  Recent and remote memory are impaired. Attention and concentration are reduced.  Able to name objects and repeat phrases.  Hearing is intact to conversational tone.   Sensation: Sensation is intact to light touch throughout Motor: Strength is at least antigravity x4. DTR's 2/4 in UE/LE     Movement examination: Tone: There is normal tone in the UE/LE Abnormal movements:  no tremor.  No myoclonus.  No asterixis.   Coordination:  There is no decremation with RAM's. Normal finger  to nose  Gait and Station: The patient has no difficulty arising out of a deep-seated chair without the use of the hands. The patient's stride length is good.  Gait is cautious and narrow.    Thank you for allowing [place] the opportunity to participate in the care of this nice patient. Please do not hesitate to contact us for any questions or concerns.   Total time spent on today's visit was 32 minutes dedicated to this patient today, preparing to see patient, examining the patient, ordering tests and/or medications and counseling the patient, documenting clinical information in the EHR or other health record, independently interpreting results and communicating results to the patient/family, discussing treatment and goals, answering patient's questions and coordinating care.  Cc:  Farris Has, MD  Marlowe Kays 07/26/2022 11:38 AM

## 2022-08-08 ENCOUNTER — Other Ambulatory Visit: Payer: Self-pay | Admitting: General Surgery

## 2022-08-08 ENCOUNTER — Ambulatory Visit
Admission: RE | Admit: 2022-08-08 | Discharge: 2022-08-08 | Disposition: A | Discharge status: Home / Self Care | Payer: Medicare HMO | Source: Ambulatory Visit | Attending: General Surgery | Admitting: General Surgery

## 2022-08-08 ENCOUNTER — Telehealth: Payer: Self-pay | Admitting: Physician Assistant

## 2022-08-08 DIAGNOSIS — Z9889 Other specified postprocedural states: Secondary | ICD-10-CM

## 2022-08-08 DIAGNOSIS — Z853 Personal history of malignant neoplasm of breast: Secondary | ICD-10-CM | POA: Diagnosis not present

## 2022-08-08 DIAGNOSIS — N6001 Solitary cyst of right breast: Secondary | ICD-10-CM | POA: Diagnosis not present

## 2022-08-08 HISTORY — DX: Malignant neoplasm of unspecified site of unspecified female breast: C50.919

## 2022-08-08 NOTE — Telephone Encounter (Signed)
Pt husband wants to transfer care to [facility]. Please do a referral to [facility]

## 2022-08-09 NOTE — Telephone Encounter (Signed)
Advised to POA.

## 2022-08-19 ENCOUNTER — Encounter: Payer: Self-pay | Admitting: Physician Assistant

## 2022-08-23 ENCOUNTER — Telehealth: Payer: Self-pay | Admitting: Physician Assistant

## 2022-08-23 NOTE — Telephone Encounter (Signed)
Patient dismissed form [facility] and all providers practicing at this clinic. Per patients request to transfer care to a different Neurology group 08/19/22.

## 2023-01-27 ENCOUNTER — Ambulatory Visit: Payer: Medicare HMO | Admitting: Physician Assistant

## 2023-02-06 ENCOUNTER — Other Ambulatory Visit: Payer: Self-pay | Admitting: Hematology and Oncology

## 2023-02-14 ENCOUNTER — Telehealth: Payer: Self-pay | Admitting: Hematology and Oncology

## 2023-06-23 ENCOUNTER — Inpatient Hospital Stay: Payer: Medicare HMO | Attending: Hematology and Oncology | Admitting: Hematology and Oncology

## 2023-06-23 VITALS — BP 130/90 | HR 57 | Temp 98.0°F | Resp 18 | Ht 65.0 in | Wt 212.8 lb

## 2023-06-23 DIAGNOSIS — Z79811 Long term (current) use of aromatase inhibitors: Secondary | ICD-10-CM | POA: Insufficient documentation

## 2023-06-23 DIAGNOSIS — C50411 Malignant neoplasm of upper-outer quadrant of right female breast: Secondary | ICD-10-CM | POA: Insufficient documentation

## 2023-06-23 DIAGNOSIS — Z170 Estrogen receptor positive status [ER+]: Secondary | ICD-10-CM | POA: Insufficient documentation

## 2023-06-23 DIAGNOSIS — Z1732 Human epidermal growth factor receptor 2 negative status: Secondary | ICD-10-CM | POA: Insufficient documentation

## 2023-06-23 DIAGNOSIS — Z79899 Other long term (current) drug therapy: Secondary | ICD-10-CM | POA: Diagnosis not present

## 2023-06-23 DIAGNOSIS — Z1721 Progesterone receptor positive status: Secondary | ICD-10-CM | POA: Diagnosis not present

## 2023-06-23 MED ORDER — LETROZOLE 2.5 MG PO TABS: 2.5000 mg | ORAL_TABLET | Freq: Every day | ORAL | 0 refills | Status: DC

## 2023-06-23 NOTE — Assessment & Plan Note (Addendum)
 02/27/2021:Screening mammogram: a possible mass in the right breast. Diagnostic mammogram: an indeterminate 2 mm mass in the right breast at 12 o'clock. 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  Ki-67 30%      Recommendations: 1. Breast conserving surgery: Grade 2 IDC 2 mm, margins Neg, 0/2 LN neg, ER 100%, PR 10%, Her 2 Neg, Ki 67: 30% 2. Adjuvant radiation therapy (patient decided not to do radiation) 3. Adjuvant antiestrogen therapy with letrozole  2.5 mg daily x5 years started March 2023   Letrozole  toxicities:  Breast cancer surveillance: Breast exam 06/23/2023: Benign Mammogram and ultrasound 08/08/2022: Benign breast density category B  Return to clinic in 1 year for follow-up

## 2023-06-23 NOTE — Progress Notes (Signed)
 Patient Care Team: Ronna Coho, MD as PCP - General (Family Medicine) Jhonny Moss, MD as Consulting Physician (Neurology) Auther Bo, RN as Oncology Nurse Navigator Alane Hsu, RN as Oncology Nurse Navigator  DIAGNOSIS:  Encounter Diagnosis  Name Primary?   Malignant neoplasm of upper-outer quadrant of right breast in female, estrogen receptor positive Yes    SUMMARY OF ONCOLOGIC HISTORY: Oncology History  Malignant neoplasm of upper-outer quadrant of right breast in female, estrogen receptor positive  02/27/2021 Initial Diagnosis   Screening mammogram: a possible mass in the right breast. Diagnostic mammogram: an indeterminate 2 mm mass in the right breast at 12 o'clock. 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  Ki-67 30%   03/27/2021 Cancer Staging   Staging form: Breast, AJCC 8th Edition - Clinical stage from 03/27/2021: Stage IA (cT1a, cN0, cM0, G3, ER+, PR+, HER2-) - Signed by Cameron Cea, MD on 03/29/2021 Histologic grading system: 3 grade system   04/06/2021 Surgery   Breast conserving surgery: Grade 2 IDC 2 mm, margins Neg, 0/2 LN neg, ER 100%, PR 10%, Her 2 Neg, Ki 67: 30%   04/12/2021 Genetic Testing   Negative genetic testing on the CancerNext-Expanded+RNAinsight panel.  The report date is April 12, 2021.  The CancerNext-Expanded gene panel offered by [facility] and includes sequencing and rearrangement analysis for the following 77 genes: AIP, ALK, APC*, ATM*, AXIN2, BAP1, BARD1, BLM, BMPR1A, BRCA1*, BRCA2*, BRIP1*, CDC73, CDH1*, CDK4, CDKN1B, CDKN2A, CHEK2*, CTNNA1, DICER1, FANCC, FH, FLCN, GALNT12, KIF1B, LZTR1, MAX, MEN1, MET, MLH1*, MSH2*, MSH3, MSH6*, MUTYH*, NBN, NF1*, NF2, NTHL1, PALB2*, PHOX2B, PMS2*, POT1, PRKAR1A, PTCH1, PTEN*, RAD51C*, RAD51D*, RB1, RECQL, RET, SDHA, SDHAF2, SDHB, SDHC, SDHD, SMAD4, SMARCA4, SMARCB1, SMARCE1, STK11, SUFU, TMEM127, TP53*, TSC1, TSC2, VHL and XRCC2 (sequencing and  deletion/duplication); EGFR, EGLN1, HOXB13, KIT, MITF, PDGFRA, POLD1, and POLE (sequencing only); EPCAM and GREM1 (deletion/duplication only). DNA and RNA analyses performed for * genes.      CHIEF COMPLIANT: Follow-up on letrozole  therapy  HISTORY OF PRESENT ILLNESS:   History of Present Illness The patient, a breast cancer survivor, has been on letrozole  for a couple of years to prevent recurrence. She reports no side effects from the medication, such as hot flashes or joint stiffness. The patient has been diligent about getting regular mammograms, which have all come back normal. She had a benign cyst in the breast, which is being monitored. The patient also had a bone density test done at [facility] last year, which showed good bone health.     ALLERGIES:  has no known allergies.  MEDICATIONS:  Current Outpatient Medications  Medication Sig Dispense Refill   donepezil  (ARICEPT ) 10 MG tablet TAKE 1 TABLET EVERY DAY 90 tablet 3   dorzolamide-timolol (COSOPT) 22.3-6.8 MG/ML ophthalmic solution dorzolamide 22.3 mg-timolol 6.8 mg/mL eye drops     levothyroxine (SYNTHROID) 75 MCG tablet      LISINOPRIL PO Take by mouth.     memantine  (NAMENDA ) 10 MG tablet TAKE  1 TABLET TWICE A DAY 180 tablet 3   Multiple Vitamin (MULTIVITAMIN) tablet Take 1 tablet by mouth daily.     Omega-3 Fatty Acids (FISH OIL PO) Take by mouth.     TRINTELLIX 20 MG TABS tablet Take 20 mg by mouth daily.     DULoxetine (CYMBALTA) 60 MG capsule duloxetine 60 mg capsule,delayed release     letrozole  (FEMARA ) 2.5 MG tablet Take 1 tablet (2.5 mg total) by mouth daily. 90 tablet 0   No  current facility-administered medications for this visit.    PHYSICAL EXAMINATION: ECOG PERFORMANCE STATUS: 1 - Symptomatic but completely ambulatory  Vitals:   06/23/23 1043 06/23/23 1051  BP: (!) 151/75 (!) 130/90  Pulse: (!) 57   Resp: 18   Temp: 98 F (36.7 C)   SpO2: 100%    Filed Weights   06/23/23 1043  Weight: 212 lb  12.8 oz (96.5 kg)     LABORATORY DATA:  I have reviewed the data as listed    Latest Ref Rng & Units 02/12/2020    3:59 PM  CMP  Glucose 70 - 99 mg/dL 409   BUN 8 - 23 mg/dL 19   Creatinine 8.11 - 1.00 mg/dL 9.14   Sodium 782 - 956 mmol/L 141   Potassium 3.5 - 5.1 mmol/L 4.1   Chloride 98 - 111 mmol/L 107   CO2 22 - 32 mmol/L 24   Calcium 8.9 - 10.3 mg/dL 9.6   Total Protein 6.5 - 8.1 g/dL 6.5   Total Bilirubin 0.3 - 1.2 mg/dL 0.8   Alkaline Phos 38 - 126 U/L 62   AST 15 - 41 U/L 21   ALT 0 - 44 U/L 13     Lab Results  Component Value Date   WBC 6.4 02/12/2020   HGB 14.8 02/12/2020   HCT 47.3 (H) 02/12/2020   MCV 97.1 02/12/2020   PLT 192 02/12/2020    ASSESSMENT & PLAN:  Malignant neoplasm of upper-outer quadrant of right breast in female, estrogen receptor positive 02/27/2021:Screening mammogram: a possible mass in the right breast. Diagnostic mammogram: an indeterminate 2 mm mass in the right breast at 12 o'clock. 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  Ki-67 30%      Recommendations: 1. Breast conserving surgery: Grade 2 IDC 2 mm, margins Neg, 0/2 LN neg, ER 100%, PR 10%, Her 2 Neg, Ki 67: 30% 2. Adjuvant radiation therapy (patient decided not to do radiation) 3. Adjuvant antiestrogen therapy with letrozole  2.5 mg daily x5 years started March 2023   Letrozole  toxicities: Tolerating it extremely well without any adverse effects.  Breast cancer surveillance: Mammogram and ultrasound 08/08/2022: Benign breast density category B  Return to clinic in 1 year for follow-up with a telephone visit in the following year we will see her in person.  ------------------------------------- Assessment and Plan Assessment & Plan Breast cancer Breast cancer in remission. On letrozole  post-surgery to prevent recurrence. No significant side effects. Regular mammograms clear. Benign breast cyst monitored. - Continue letrozole  therapy. - Ensure  medication refills are up to date. - Schedule mammogram for June 2025.  Bone health Bone density good as of July 2024. - Schedule bone density scan for 2026.  Follow-up Annual follow-up sufficient given current condition. - Schedule annual follow-up for 2026, potentially via phone.      No orders of the defined types were placed in this encounter.  The patient has a good understanding of the overall plan. she agrees with it. she will call with any problems that may develop before the next visit here. Total time spent: 30 mins including face to face time and time spent for planning, charting and co-ordination of care   Viinay K Caelen Reierson, MD 06/23/23

## 2023-07-08 ENCOUNTER — Other Ambulatory Visit: Payer: Self-pay | Admitting: General Surgery

## 2023-07-08 DIAGNOSIS — Z9889 Other specified postprocedural states: Secondary | ICD-10-CM

## 2023-08-11 ENCOUNTER — Ambulatory Visit
Admission: RE | Admit: 2023-08-11 | Discharge: 2023-08-11 | Disposition: A | Discharge status: Home / Self Care | Source: Ambulatory Visit | Attending: General Surgery | Admitting: General Surgery

## 2023-08-11 DIAGNOSIS — Z9889 Other specified postprocedural states: Secondary | ICD-10-CM

## 2023-10-16 ENCOUNTER — Other Ambulatory Visit: Payer: Self-pay | Admitting: Hematology and Oncology

## 2024-01-12 ENCOUNTER — Other Ambulatory Visit: Payer: Self-pay | Admitting: Physician Assistant

## 2024-06-22 ENCOUNTER — Telehealth: Admitting: Hematology and Oncology
